# Patient Record
Sex: Female | Born: 1940 | Race: Black or African American | Hispanic: No | Marital: Married | Smoking: Former smoker
Health system: Southern US, Community
[De-identification: ages and names within clinical notes are randomized; demographics above are authoritative.]

## PROBLEM LIST (undated history)

## (undated) DIAGNOSIS — I10 Essential (primary) hypertension: Secondary | ICD-10-CM

## (undated) DIAGNOSIS — I701 Atherosclerosis of renal artery: Secondary | ICD-10-CM

## (undated) DIAGNOSIS — I2510 Atherosclerotic heart disease of native coronary artery without angina pectoris: Secondary | ICD-10-CM

## (undated) DIAGNOSIS — N189 Chronic kidney disease, unspecified: Secondary | ICD-10-CM

## (undated) DIAGNOSIS — Z8489 Family history of other specified conditions: Secondary | ICD-10-CM

## (undated) DIAGNOSIS — Z959 Presence of cardiac and vascular implant and graft, unspecified: Secondary | ICD-10-CM

## (undated) DIAGNOSIS — E785 Hyperlipidemia, unspecified: Secondary | ICD-10-CM

## (undated) DIAGNOSIS — I739 Peripheral vascular disease, unspecified: Secondary | ICD-10-CM

## (undated) HISTORY — DX: Chronic kidney disease, unspecified: N18.9

## (undated) HISTORY — DX: Peripheral vascular disease, unspecified: I73.9

## (undated) HISTORY — DX: Atherosclerotic heart disease of native coronary artery without angina pectoris: I25.10

## (undated) HISTORY — DX: Atherosclerosis of renal artery: I70.1

## (undated) HISTORY — DX: Essential (primary) hypertension: I10

## (undated) HISTORY — DX: Hyperlipidemia, unspecified: E78.5

---

## 1983-01-07 HISTORY — PX: GALLBLADDER SURGERY: SHX652

## 1999-06-24 ENCOUNTER — Encounter: Admission: RE | Admit: 1999-06-24 | Discharge: 1999-09-22 | Payer: Self-pay | Admitting: Emergency Medicine

## 1999-07-05 ENCOUNTER — Inpatient Hospital Stay (HOSPITAL_COMMUNITY): Admission: EM | Admit: 1999-07-05 | Discharge: 1999-07-10 | Payer: Self-pay | Admitting: Emergency Medicine

## 1999-07-05 ENCOUNTER — Encounter: Payer: Self-pay | Admitting: Emergency Medicine

## 1999-07-05 ENCOUNTER — Encounter (INDEPENDENT_AMBULATORY_CARE_PROVIDER_SITE_OTHER): Payer: Self-pay | Admitting: *Deleted

## 1999-07-06 HISTORY — PX: CORONARY ANGIOPLASTY WITH STENT PLACEMENT: SHX49

## 1999-07-07 ENCOUNTER — Encounter: Payer: Self-pay | Admitting: Emergency Medicine

## 1999-08-20 ENCOUNTER — Encounter: Admission: RE | Admit: 1999-08-20 | Discharge: 1999-08-20 | Payer: Self-pay | Admitting: Emergency Medicine

## 1999-08-20 ENCOUNTER — Encounter: Payer: Self-pay | Admitting: Emergency Medicine

## 2003-01-04 ENCOUNTER — Encounter: Admission: RE | Admit: 2003-01-04 | Discharge: 2003-01-04 | Payer: Self-pay | Admitting: Emergency Medicine

## 2003-01-12 ENCOUNTER — Encounter: Admission: RE | Admit: 2003-01-12 | Discharge: 2003-04-12 | Payer: Self-pay | Admitting: Emergency Medicine

## 2005-08-21 ENCOUNTER — Encounter: Admission: RE | Admit: 2005-08-21 | Discharge: 2005-08-21 | Payer: Self-pay | Admitting: Emergency Medicine

## 2008-10-24 ENCOUNTER — Encounter: Admission: RE | Admit: 2008-10-24 | Discharge: 2008-10-24 | Payer: Self-pay | Admitting: Emergency Medicine

## 2009-10-25 ENCOUNTER — Encounter: Admission: RE | Admit: 2009-10-25 | Discharge: 2009-10-25 | Payer: Self-pay | Admitting: Internal Medicine

## 2010-04-02 ENCOUNTER — Inpatient Hospital Stay (HOSPITAL_COMMUNITY)
Admission: RE | Admit: 2010-04-02 | Discharge: 2010-04-03 | DRG: 700 | Disposition: A | Discharge status: Home / Self Care | Payer: Medicare Other | Source: Ambulatory Visit | Attending: Cardiovascular Disease | Admitting: Cardiovascular Disease

## 2010-04-02 DIAGNOSIS — Z7982 Long term (current) use of aspirin: Secondary | ICD-10-CM

## 2010-04-02 DIAGNOSIS — I129 Hypertensive chronic kidney disease with stage 1 through stage 4 chronic kidney disease, or unspecified chronic kidney disease: Secondary | ICD-10-CM | POA: Diagnosis present

## 2010-04-02 DIAGNOSIS — Z794 Long term (current) use of insulin: Secondary | ICD-10-CM

## 2010-04-02 DIAGNOSIS — Z79899 Other long term (current) drug therapy: Secondary | ICD-10-CM

## 2010-04-02 DIAGNOSIS — N189 Chronic kidney disease, unspecified: Secondary | ICD-10-CM | POA: Diagnosis present

## 2010-04-02 DIAGNOSIS — I701 Atherosclerosis of renal artery: Principal | ICD-10-CM | POA: Diagnosis present

## 2010-04-02 DIAGNOSIS — I2510 Atherosclerotic heart disease of native coronary artery without angina pectoris: Secondary | ICD-10-CM | POA: Diagnosis present

## 2010-04-02 DIAGNOSIS — G20 Parkinson's disease: Secondary | ICD-10-CM | POA: Diagnosis present

## 2010-04-02 DIAGNOSIS — G20A1 Parkinson's disease without dyskinesia, without mention of fluctuations: Secondary | ICD-10-CM | POA: Diagnosis present

## 2010-04-02 LAB — GLUCOSE, CAPILLARY
Glucose-Capillary: 149 mg/dL — ABNORMAL HIGH (ref 70–99)
Glucose-Capillary: 135 mg/dL — ABNORMAL HIGH (ref 70–99)
Glucose-Capillary: 179 mg/dL — ABNORMAL HIGH (ref 70–99)

## 2010-04-03 LAB — BASIC METABOLIC PANEL
BUN: 54 mg/dL — ABNORMAL HIGH (ref 6–23)
Calcium: 8.2 mg/dL — ABNORMAL LOW (ref 8.4–10.5)
Creatinine, Ser: 3.13 mg/dL — ABNORMAL HIGH (ref 0.4–1.2)
GFR calc non Af Amer: 15 mL/min — ABNORMAL LOW (ref >60)
Glucose, Bld: 210 mg/dL — ABNORMAL HIGH (ref 70–99)

## 2010-04-03 LAB — CBC
HCT: 32.4 % — ABNORMAL LOW (ref 36.0–46.0)
MCH: 26.1 pg (ref 26.0–34.0)
MCHC: 32.1 g/dL (ref 30.0–36.0)
MCV: 81.4 fL (ref 78.0–100.0)
Platelets: 176 10*3/uL (ref 150–400)
RDW: 15 % (ref 11.5–15.5)

## 2010-04-03 LAB — GLUCOSE, CAPILLARY: Glucose-Capillary: 162 mg/dL — ABNORMAL HIGH (ref 70–99)

## 2010-04-03 NOTE — Procedures (Signed)
  NAMEALINAH, Sierra Riley                ACCOUNT NO.:  [id number]  MEDICAL RECORD NO.:  [id number]           PATIENT TYPE:  O  LOCATION:  6529                         FACILITY:  [facility]  PHYSICIAN:  Nanetta Batty, M.D.   DATE OF BIRTH:  1940/04/20  DATE OF PROCEDURE: DATE OF DISCHARGE:                   PERIPHERAL VASCULAR INVASIVE PROCEDURE   Abdominal aortogram with CO2.  Ms. Bocanegra is a 70 year old thin-appearing African American female, history of CAD, chronic renal insufficiency with a creatinines in the 2 range and hypertension.  She has been refractory to multiple medications.  Renal Dopplers showed moderate right renal artery stenosis and this has been documented on cath in the past as well.  The patient presents now for a CO2 angiography to rule out significant stenosis as a cause of hypertension.  PROCEDURE DESCRIPTION:  The patient was brought to the Second Floor [facility] in the postabsorptive state.  She is premedicated with p.o. Valium.  Her right groin was prepped and shaved in the usual sterile fashion. Xylocaine 1% was used for local anesthesia. A 6-French sheath was inserted into the right femoral artery using standard Seldinger technique.  A 5-French tennis racket catheter was used for midstream abdominal aortography using CO2.  Retrograde aortic pressures were monitored during the case.  ANGIOGRAPHIC RESULTS: 1. Right renal artery had 60% proximal stenosis. 2. Left renal artery was normal.  IMPRESSION:  Ms. Kniskern has moderate right renal artery stenosis, probably not significant enough to be causing renal insufficiency or renal vessel hypertension. Description of this has not changed compared to prior angiogram or cath. She will be treated for essential hypertension medically.  The patient's blood pressure was 240/90.  She will be treated with IV hydralazine for sheath removal, will be kept overnight to observe her groin and will be  discharged home with follow up with Dr. Alanda Amass. She left the lab in stable condition.     Nanetta Batty, M.D.     Cordelia Pen  D:  04/02/2010  T:  04/03/2010  Job:  [id number]  cc:   Second [facility] Richard A. Alanda Amass, M.D. Cecille Aver, M.D. Gaspar Garbe, M.D.  Electronically Signed by Nanetta Batty M.D. on 04/03/2010 05:10:45 PM

## 2010-04-16 NOTE — Discharge Summary (Signed)
[name], [name]                ACCOUNT NO.:  [id number]  MEDICAL RECORD NO.:  [id number]           PATIENT TYPE:  O  LOCATION:  6529                         FACILITY:  [facility]  PHYSICIAN:  Nanetta Batty, M.D.   DATE OF BIRTH:  24-Feb-1940  DATE OF ADMISSION:  04/02/2010 DATE OF DISCHARGE:  04/03/2010                              DISCHARGE SUMMARY   DISCHARGE DIAGNOSES: 1. Chronic renal failure with history by abdominal angiogram of 70%     right renal artery stenosis and discrepancy in outpatient followup     renal Dopplers with moderate disease of the right renal artery with     right renal artery renal velocity of 224/46, renal aortic ratio of     2.54.  There was concern to chronic renal failure that the renal     artery stenosis was increasing this problem.  CO2 and angiography     was planned. 2. 60% proximal right artery stenosis by CT angiogram.  Normal left     renal artery, 3. Coronary artery disease. 4. Severe systemic hypertension, refractory on multiple medications. 5. Parkinson disease.  DISCHARGED CONDITION:  Stable.  DISCHARGE MEDICATIONS:  See medication reconciliation sheet.  DISCHARGE INSTRUCTIONS: 1. Increase activity slowly.  May shower.  No lifting for 1 week.  No     driving for 2 days.  Low-sodium heart-healthy diabetic diet.  We     stopped the Vytorin due to possible interactions, now on Zetia and     Crestor. 2. Wash cath site with soap and water.  Call if any bleeding,     swelling, or drainage. 3. Follow up with Dr. Alanda Amass.  The office will call with date and     time.  HOSPITAL COURSE:  Sierra Riley was admitted electively for PV angio renal arteries with CO2 to evaluate her renal arteries as there was discrepancy between her last angiogram and renal Dopplers, and there was concern to her chronic renal failure if renal artery stenosis was causing issues.  She underwent CO2 angiogram without complications and was found to have 60% proximal  right renal artery stenosis.  PHYSICAL EXAMINATION:  VITAL SIGNS:  The next morning, her blood pressure was 168/64, pulse 67, respirations 15, temperature 98.5, oxygen saturation on room air 97%. HEART:  Regular rate and rhythm. EXTREMITIES:  Right groin was stable.  Sodium 139, potassium 4.4, BUN 54, creatinine 3.13, glucose 210. Hemoglobin 10.4, hematocrit 32.4, platelets 176, and WBC 6.3.  The patient was stable and ambulated without complications and discharged home.  She will follow up as an outpatient with Dr. Alanda Amass and her regular doctors at regular points.  Please note per Dr. Hazle Coca note, blood pressure during the procedure was 240/90.  He kept her overnight just to observe the groin to ensure no hematomas, which did not occur.  She was stable and discharged the next morning.     Darcella Gasman. Annie Paras, N.P.   ______________________________ Nanetta Batty, M.D.    LRI/MEDQ  D:  04/05/2010  T:  04/06/2010  Job:  [id number]  cc:   Gerlene Burdock A. Alanda Amass, M.D. Cecille Aver, M.D.  Gaspar Garbe, M.D.  Electronically Signed by Nada Boozer N.P. on 04/08/2010 11:00:52 AM Electronically Signed by Nanetta Batty M.D. on 04/16/2010 10:45:01 AM

## 2010-05-24 NOTE — Op Note (Signed)
[place]. [facility]  Patient:    Sierra Riley, Sierra Riley                         MRN: [id number] Adm. Date:  [id number] Attending:  Roque Lias CC:         Norton Blizzard, M.D. x 2             Reuben Likes, M.D.                           Operative Report  PREOPERATIVE DIAGNOSIS:  Mediastinal adenopathy.  POSTOPERATIVE DIAGNOSIS:  Mediastinal adenopathy.  OPERATION PERFORMED:  Fiberoptic bronchoscopy, mediastinoscopy.  SURGEON:  D. Karle Plumber, M.D.  ANESTHESIA:  General.  PROCEDURE:  After percutaneous insertion of all monitoring lines, the patient underwent general anesthesia.  Fiberoptic bronchoscopy was passed through the endotracheal tube.  The right upper lobe, right middle lobe, right lower lobe orifices were normal.  The carina was in the midline.  The left upper lobe and left lower lobe orifices were normal.  No endobronchial lesions were seen. Washings and cultures were taken with bronchial washings.  The fiberoptic bronchoscope was removed and the anterior neck was prepped and draped in usual sterile manner.  A transverse incision was made above the sternal notch and carried down with electrocautery to the subcutaneous tissue. The strap muscles were split.  The pretracheal fascia was entered.  The ______ was carried out and then the mediastinoscope was inserted and a biopsies of 4R and 2R nodes were done.  Strap muscles were closed with 2-0 Vicryl and subcutaneous tissue with 3-0 Vicryl and subcuticular with 3-0 Vicryl.  Steri-Strips were applied. The patient tolerated the procedure well. DD:  07/08/99 TD:  07/08/99 Job: [id number]

## 2010-05-24 NOTE — H&P (Signed)
[place]. [facility]  Patient:    Sierra Riley, Sierra Riley                         MRN: [id number] Adm. Date:  [id number] Attending:  Roque Lias                         History and Physical  CHIEF COMPLAINT:  "Chest pain."  HISTORY OF PRESENT ILLNESS:  The patient is a 70 year old female with diabetes and hypertension who has had a one-week history of recurring episodes of substernal chest pain with radiation to the neck, face, and right arm. The episodes last from one to four hours at a time and are described as "grabbing" and of 7-8/10 in intensity. They are associated with nausea, vomiting, sweats, dyspnea, and palpitations. The pains are nonexertional and tend to occur after meals. The patient has no known cardiac history.  PAST SURGICAL HISTORY:  The patient had a cholecystectomy in 1985.  OTHER HOSPITALIZATIONS:  None.  OTHER ILLNESSES:  The patient has been treated for hypertension since 1997, hyperlipidemia since 1997, and was just diagnosed with type 2 diabetes on June 18, 1999.  CURRENT MEDICATIONS: 1. HCTZ 25 mg once a day. 2. Verapamil SR 120 once a day. 3. Glucophage 500 mg b.i.d.  ALLERGIES:  No known drug allergies.  FAMILY HISTORY:  The patients father died of a heart attack. Her mother has high blood pressure and had a CVA. She had three brothers, two of whom have high blood pressure, and four sisters, one of whom has high blood pressure. She has four children all in good health.  SOCIAL HISTORY:  The patient works as a Public librarian. She does not use alcohol. She smokes a pack of cigarettes per day. She is married.  REVIEW OF SYSTEMS:  She denies any other systemic skin, eye, ENT, respiratory, cardiovascular, GI, GU, musculoskeletal, or neurological complaints.  PHYSICAL EXAMINATION:  VITAL SIGNS:  Blood pressure 150/60, pulse 58 and regular, respirations 20, temperature 98.5.  GENERAL:  The patient appears alert and  in no distress.  SKIN:  Warm and dry. No rash.  HEENT:  Eyes:  Pupils are equal, round, and reactive to light. Full EOMs. Has bilateral cataracts. Fundi benign. Sclerae nonicteric. ENT:  TMs normal. No intraoral lesions. Pharynx clear. Mucous membranes moist.  NECK:  Supple. No adenopathy, JVD, or bruits. Thyroid normal.  LUNGS:  Clear to auscultation and percussion.  HEART:  Regular rhythm. No murmur, clicks, rubs, or gallops.  CHEST:  No chest wall tenderness.  ABDOMEN:  Soft and nontender without organomegaly or mass. Bowel sounds normally active. No pulsatile midline abdominal mass or bruit.  EXTREMITIES:  No peripheral edema. Pedal pulses are full. Foot exam was normal.  NEUROLOGICAL:  Alert and oriented x 3. Speech was clear and appropriate. No focal muscular weakness or tremor. DTRs 2+ and symmetrical. Babinskis downgoing. Cranial nerves intact.  LABORATORY DATA:  An EKG showed sinus bradycardia but otherwise was normal.  Chest x-ray showed a lobulated contour to both hila.  A chest CT showed hilar and mediastinal adenopathy.  IMPRESSION: 1. Chest pain, rule out angina or possibly due to lung cancer. 2. Hilar adenopathy, possibly due to lung cancer or sarcoidosis. 3. Type 2 diabetes. 4. Hypertension. 5. Hyperlipidemia. 6. History of nicotine addiction.  PLAN: 1. Admit to telemetry. 2. Serial CPK, MBs, and EKGs. 3. Cardiology consult. 4. CVTS consult. DD:  07/05/99 TD:  07/05/99 Job: [id number]

## 2010-05-24 NOTE — Op Note (Signed)
[place]. [facility]  Patient:    KAILLY, RICHOUX                         MRN: [id number] Proc. Date: 07/08/99 Adm. Date:  [id number] Attending:  Roque Lias CC:         Norton Blizzard, M.D. (2 copies)             Reuben Likes, M.D.                           Operative Report  PREOPERATIVE DIAGNOSIS:  Mediastinal adenopathy.  POSTOPERATIVE DIAGNOSIS:  Mediastinal adenopathy.  OPERATION:  Thyroid bronchoscopy and mediastinoscopy.  SURGEON:  D. Karle Plumber, M.D.  ANESTHESIA:  General.  DESCRIPTION OF OPERATION:  After percutaneous insertion of all monitoring lines, the patient underwent general anesthesia.  The anterior thryroid bronchoscope was passed through the endotracheal tube.  The right upper lobe, right middle lobe and right lower lobe orifices were normal.  The carina was in the midline.  The left upper lobe and left lower lobe orifices were normal. No intrabronchial lesions were seen.  Washings and cultures were taken with bronchial washings.  The fiberoptic bronchoscope was removed.  The anterior neck was prepped and draped in the usual sterile manner.  A transverse incision were made above the sternal notch and carried down to the subcutaneous tissue.  The strap muscles were split and the pretracheal fascia was entered.  Digital exploration was carried out and then the mediastinoscope was inserted and biopsy of 4R and 2R nodes were done.  The strap muscles were closed with 2-0 Vicryl, subcutaneous tissue with 3-0 Vicryl, and subcuticular with 3-0 Vicryl.  Steri-Strips were applied.  The patient tolerated the procedure well. DD:  07/08/99 TD:  07/08/99 Job: [id number]

## 2010-05-24 NOTE — Op Note (Signed)
[place]. [facility]  Patient:    Sierra Riley, Sierra Riley                         MRN: [id number] Proc. Date: 07/05/99 Adm. Date:  [id number] Attending:  Roque Lias CC:         Reuben Likes, M.D.             D. Karle Plumber, M.D.             Cardiac Catheterization Lab                           Operative Report  PROCEDURE:  Retrograde central aortic catheterization, selective coronary angiography pre and post intracoronary nitroglycerin administration, left ventricular angiography RAO and LAO projection, subselective left internal mammary artery-right internal mammary artery, abdominal aortic angiogram hand injection mid stream PA projection, Plavix p.o., weight-adjusted heparin, Aggrastat bolus plus infusion, percutaneous transluminal coronary angioplasty and stent using double-wire technique, mid circumflex-PABG, percutaneous transluminal coronary angioplasty and stent mid left anterior descending artery, percutaneous transluminal coronary angioplasty proximal nondominant right coronary artery high grade stenosis.  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  She has a history of elevated cholesterol, probably recent diagnosis; mild hypertension.  She has a one-week history of recurrent episodic chest pain.  Approximately seven to eight days prior to admission, she had prolonged chest pain that was substernal and lasted through the night. Several days later, she had a recurrent episode.  Two nights ago, she had another recurrent episode that lasted most of the night and this morning, she has recurrent substernal pressure/pain radiating to her shoulders and upper left arm prompting her to come to emergency room, similar to her prior episodes.  It was relieved in the ER with analgesic and intravenous nitroglycerin  administration.  EKG showed nonspecific diffuse inferolateral ST changes.  The patient was given 5000 units of heparin, 150 of Plavix in the emergency room as ancillary therapy.  X-rays showed significant bilateral hilar adenopathy, right greater than left, and the patient has had a recent cough without hemoptysis.  There have been no constitutional symptoms of weight loss, fever, chills, anorexia, or pruritus. CT scan showed bilateral diffuse adenopathy.  She was seen in consultation by Dr. Dewayne Shorter who felt that this should be worked up and that she would require a mediastinoscopy.  Because of her history of ischemic chest pain, significant risk factors, and abnormal ST segment changes, it was felt best to proceed with angiography prior to this.  PROCEDURAL NOTE:  Catheterization was done through a 6-French Cordis sidearm sheath that was placed in the RFA by modified Seldinger technique with a #18 thin-walled needle after 5 mg of Valium p.o. premedication.  The patient received 2 mg of Versed and 2 mg of Nubain for sedation in the lab.  Hexabrix dye was used throughout the procedure.  Catheters were 6-French 4-cm taper, preformed Scimed coronary and pigtail catheters for coronary angiography, subselective LIMA-RIMA, LV angiogram in the RAO and LAO projection, 25 cc/14 cc per second, 20 cc/12 cc per second.  Abdominal angiogram was done in the mid stream PA projection above the level of the renal arteries at 25 cc/20 cc per second.  Because of right renal artery stenosis, right renal angiogram was done using the  right coronary catheter and hand injection.  This demonstrated 70% proximal segmental right renal artery stenosis.  The catheter was removed, and the sidearm sheath was flushed.  The patient was pain-free and stable at this time.  FINDINGS:  LV angiogram in the RAO and LAO projection showed hypo-akinesis of the basilar third of the inferior wall and posteroapical  hypo-akinesis.  Overall systolic function was well preserved at approximately 55% despite these wall motion abnormalities, and there was mild angiographic LVH.  There was no prolapse seen.  Fluoroscopy revealed +1 right coronary calcification.  There was no significant intracardiac or valvular calcification.  1. The main left coronary was normal. 2. The LAD had 75% concentric stenosis at the junction of the proximal mid    third after the large first diagonal branch.  The remainder of the LAD had    no significant stenosis, but there were minor irregularities compatible    with noncritical coronary atherosclerosis in the proximal third of the LAD    and the mid portion.  The vessel was large and coursed to the apex of the    heart with TIMI-3 flow.  The first diagonal was normal, arising from the    proximal third of the LAD and bifurcated. 3. The circumflex artery was a dominant vessel.  There was 40% narrowing after    two atrial branches and before the large first marginal branch which    bifurcated.  Between the first marginal, the PABG, and the large distal    circumflex, there was a 95% stenosis that was concentric and extended    beyond the bifurcation and involving the origin of the PABG branch and just    beyond this in the circumflex proper.  This was a near-bifurcation lesion.    There was TIMI-3 flow to the distal vessel. 4. The right coronary artery was a nondominant vessel.  It was predominantly    two large RV branches.  The largest branch bifurcated and had a 95%    stenosis in its ostial portion just after a smaller RV branch.  The smaller    RV branch had tandem 60% beadlike stenosis but good flow and no critical    stenosis.  In view of the patients history as outlined above, it was felt best to proceed with percutaneous intervention since she would need a workup.  We also wanted short-acting anticoagulants and antiplatelet therapy.  With her history of diabetes, it  was felt necessary to use 2b3a inhibitors in this setting as well.  The left coronary was intubated with a JL4 Scimed 6-French guiding catheter.  HTF wire was used to cross the circumflex stenosis which was free in the circumflex proper across the lesion.  The lesion was predilated with an ACS 2.5 x 15-mm CrossSail balloon at 5-49.  The PABG branch was intact after this. The mid circumflex was then stented with an AVE-Medtronic S7 3.0 x 15-mm stent at 10 atmospheres for 38 seconds.  There was excellent stent apposition and full expansion on scout injection.  The PABG branch, however, had a 95% pinch lesion at its ostium in an area where there was plaque.  For this reason, we left a wire across the stent and used the second HTF wire, crossed through the AVE stent into the PABG branch which supplied the PDA and PLA on this dominant circumflex vessel.  The side branch was dilated with a 2.5 x 9-mm Maverick balloon at 10 atmospheres for 44 seconds.  The  stent was widely expanded, and the ostial stenosis was reduced from 95% to less than 20% in the PABG branch with TIMI-3 flow.  We then approached the LAD.  In the proximal-mid LAD beyond the first diagonal branch was crossed with an HTF wire, and the lesion was primarily stented with a 3.0 x 12-mm NIR stent which was deployed at 13 atmospheres for 64 seconds. Final injections showed stenosis reduction of 75% to 0% with good TIMI-3 flow in the LAD.  There was some minor 30% narrowing beyond it and minimal systolic impression in the mid LAD but good flow and no dissection.  We then intubated the right coronary artery with a hockey-stick 6-French guiding catheter.  A Aneliz PT 0.014-inch Scimed wire was used to cross the stenosis into the large bifurcating RV branch.  The RV branch was then dilated across the other RV branch with a 2.5 x 9-mm Maverick at nine for 30, and 10 atmospheres for 44 seconds.  The balloon was pulled back, and  injections showed stenosis reduction of 95% to less than 20% residual.  There was some beading in this area but the result was nice and smooth and almost "stent-like".  There was some ectasia of this nondominant RCA proximal to the lesion and just beyond it.  Because of the good angiographic result, the size of the vessel and the fact that there was proximal distal ectasia before the lesion, we elected not to stent this lesion.  There was TIMI-3 flow, and the patient was stable.  CONCLUSION:  The patient has had successful multi-vessel, multi-lesion intervention for high grade three-vessel disease.  This was done in the setting of significant bilateral hilar adenopathy which is a new diagnosis and cough in a patient who is a smoker without any evidence of lung mass on CT.  RECOMMENDATIONS:  I would recommend vigorous continued medical therapy of hypertension, diabetes, hyperlipidemia, risk modification with discontinuation of smoking.  I believe the patients 2b3a inhibitor should well be out of the system in several days and that she would be a candidate for further workup with planned mediastinoscopy per Dr. Edwyna Shell.  CATHETERIZATION DIAGNOSES:  1. Arteriosclerotic heart disease, probable out-of-hospital small DMI seven     to nine days prior to admission.  2. Three-vessel coronary artery disease.  3. Successful culprit lesion percutaneous transluminal coronary angioplasty     and stent, double-wire technique, mid dominant circumflex stent and     percutaneous transluminal coronary angioplasty side branch large PABG.  4. Successful percutaneous transluminal coronary angioplasty and stent, high     grade proximal-mid left anterior descending artery stenosis.  5. Successful percutaneous transluminal coronary angioplasty, high grade     proximal nondominant right coronary artery stenosis.  6. Good systolic function, ejection fraction approximately 55% with inferior     and posteroapical  wall motion abnormalities.  7. Systemic hypertension, mild.  8. Hyperlipidemia.  9. Recent diagnosis of adult-onset diabetes mellitus. 10. Bilateral hilar adenopathy with cough, workup in progress. 11. Cigarette abuse.  DD:  07/05/99 TD:  07/06/99 Job: [id number]

## 2010-05-24 NOTE — Discharge Summary (Signed)
[place]  Patient:    Sierra Riley, Sierra Riley                         MRN: [id number] Adm. Date:  [id number] Disc. Date: [id number] Attending:  Roque Lias                           Discharge Summary  HISTORY OF PRESENT ILLNESS:  The patient is a 70 year old female with diabetes and hypertension, who has had a one-week history of recurring episodes of substernal chest pain with radiation to the neck, face, and right arm.  The episodes last from one to four hours at a time and are described as a "grabbing" pain with intensity of 7-8/10.  They are associated with nausea, vomiting, sweats, dyspnea, and palpitations.  The pains are non-exertional and tend to occur after meals.  The patient has no known cardiac history.  PHYSICAL EXAMINATION:  Blood pressure 150/60, pulse 58 and regular, respirations 20, temperature 98.5 degrees.  GENERAL APPEARANCE:  She appeared alert and in no distress.  SKIN:  Warm and dry.  HEENT:  PERRL.  Fundi benign.  Has bilateral cataracts.  ENT unremarkable.  NECK:  No adenopathy, JVD, or bruit.  Thyroid normal.  LUNGS:  Clear.  CARDIAC:  Rhythm regular without murmurs, rubs, or gallops.  CHEST:  No chest wall tenderness.  ABDOMEN:  Soft and nontender without organomegaly or mass.  Bowel sounds normally active.  No pulsatile midline abdominal mass or bruit.  EXTREMITIES:  No pedal edema.  The peripheral pulses were full.  The foot exam was negative.  NEUROLOGIC:  Exam within normal limits.  ADMITTING IMPRESSION: 1. Chest pain, rule out angina or possibly due to lung cancer. 2. Hilar adenopathy possibly due to lung cancer or sarcoidosis. 3. Recent onset of type 2 diabetes. 4. Hypertension. 5. Hyperlipidemia. 6. History of nicotine addiction.  LABORATORY DATA:  The EKG on admission showed normal sinus rhythm with occasional premature ectopic complexes, otherwise normal.  A follow-up tracing the next day  revealed inverted T wave in lead 3, but otherwise appeared to be normal.  A CT of the lower extremities revealed no evidence of DVT and uterine fibroids.  A chest x-ray revealed a lobulated contour to both hila.  A CT of the chest revealed marked hilar and mediastinal adenopathy extending to the level of the upper abdomen.  No definite lung mass was noted.  The chest x-ray revealed mild bilateral bibasilar atelectasis on July 07, 1999.  The hemoglobin was 14.2 and white count 6300.  CMET revealed a sodium of 137, potassium 3.3, glucose 164, BUN 16, and creatinine 0.7.  The angiotensin converting enzyme was 22.  The CPK on admission was normal with negative MBs. Troponins were within normal limits.  However, on July 06, 1999, her total CPK was 84 and MB was 6.5, which is positive.  A lipid panel revealed a cholesterol of 165, triglycerides 92, HDL 34, and LDL 113.  The urinalysis revealed positive nitrites, moderate leukocyte esterase, 6-10 wbcs, and many bacteria.  Blood cultures were negative.  Bronchial washings were negative. Lymph nodes were negative.  HOSPITAL COURSE:  The patient was hospitalized on the 3300 floor.  She was put at bed rest with bathroom privileges.  Vital signs were obtained every two hours for 12 hours and then every four hours.  She was given morphine for pain, Phenergan for nausea  and vomiting, Prevacid 30 mg b.i.d., HCTZ 25 mg once a day, and glyburide 5 mg per day.  She was seen in consultation by Gerlene Burdock A. Alanda Amass, M.D.  On the day of admission, she was taken to the catheterization laboratory and underwent heart catheterization, which did show three-vessel disease.  She was angioplastied and then stented.  She tolerated this procedure well.  By the following day, she had no chest pain since the angioplasty.  Her vital signs were stable.  She was transferred from there to the heart floor and was seen by CVTS for possible biopsy.  She was continued on Plavix  and aspirin.  She was given Aggrastat post angioplasty.  She did develop a temperature of 100.9 degrees on July 07, 1999, and was given IV Levaquin.  Her temperature came down and remained stable throughout the remainder of her hospital stay.  On July 08, 1999, she underwent mediastinoscopy.  This showed a granulomatous lymphadenitis consistent with sarcoidosis.  The patient tolerated the procedure well.  She had no further chest discomfort.  As of July 10, 1999, she had slight chest soreness around the incision, but no other chest pain and no cough or dyspnea.  She was afebrile.  Her blood pressure was 125/75.  Her lungs showed some rales at the bases.  The cardiac rhythm was regular with no murmur.  The abdomen was negative.  It was felt she could be discharged on that date to be followed up as an outpatient.  FINAL DIAGNOSES: 1. Ischemic heart disease, status post angioplasty. 2. Sarcoidosis. 3. Hypertension. 4. Diabetes. 5. Hyperlipidemia. 6. Nicotine addiction.  DISCHARGE MEDICATIONS: 1. Toprol XL 25 mg once a day. 2. Plavix 75 mg once a day for four weeks and then stop. 3. Altace 2.5 mg once a day. 4. Lipitor 10 mg once a day. 5. Nitroglycerin 1/150 sublingual as needed. 6. Enteric-coated aspirin 325 mg once a day. 7. Wellbutrin SR 150 mg b.i.d. eight hours apart to facilitate smoking    cessation. 8. Glyburide 5 mg once a day.  ACTIVITY:  No restriction.  DIET:  Low-fat diabetic diet.  SPECIAL INSTRUCTIONS:  Should discontinue smoking.  FOLLOW-UP:  See me in a week.  Follow up with Richard A. Alanda Amass, M.D., as well. DD:  07/27/99 TD:  07/30/99 Job: [id number]

## 2010-06-13 ENCOUNTER — Encounter: Payer: Medicare Other | Admitting: Vascular Surgery

## 2010-07-08 ENCOUNTER — Encounter: Payer: Self-pay | Admitting: Vascular Surgery

## 2010-07-18 ENCOUNTER — Encounter (INDEPENDENT_AMBULATORY_CARE_PROVIDER_SITE_OTHER): Payer: Medicare Other

## 2010-07-18 ENCOUNTER — Ambulatory Visit (INDEPENDENT_AMBULATORY_CARE_PROVIDER_SITE_OTHER): Payer: Medicare Other | Admitting: Vascular Surgery

## 2010-07-18 ENCOUNTER — Encounter: Payer: Self-pay | Admitting: Vascular Surgery

## 2010-07-18 VITALS — BP 145/71 | HR 54 | Resp 16

## 2010-07-18 DIAGNOSIS — N186 End stage renal disease: Secondary | ICD-10-CM

## 2010-07-18 DIAGNOSIS — N184 Chronic kidney disease, stage 4 (severe): Secondary | ICD-10-CM

## 2010-07-18 DIAGNOSIS — Z01810 Encounter for preprocedural cardiovascular examination: Secondary | ICD-10-CM

## 2010-07-18 NOTE — Progress Notes (Signed)
Patient is a 70 year old female referred for evaluation for placement of long-term hemodialysis access. Renal failure thought to be primarily secondary to hypertension. She is right-handed. She has had no prior access procedures.  Other chronic medical problems include diabetes hyperlipidemia, coronary artery disease, Parkinson's disease, peripheral arterial disease these problems are currently stable and followed by her primary care doctor.  Social history: Married, 4 children, former smoker quit in 2001, does not consume alcohol regularly  Family history mother hypertension and kidney disease, father vascular disease  Review of systems:  Gen.: Loss of appetite  Vascular: Pain in the legs when walking [place]  Cardiac negative  GI constipation  Neurologic negative  Ulnar A. negative  Hematologic negative  Urinary some frequency  EENT decreased eyesight  Musculoskeletal multiple joint pain.  Psychiatric negative  Skin negative  Physical exam  HEENT negative Chest clear to auscultation  Cardiac regular rate and rhythm without murmur  Abdomen nontender soft no mass  Neurologic upper extremity and lower extremity motor strength are 5 over 5  Skin no open ulcers or rashes  Vascular: Vaguely palpable radial pulse 2+ brachial pulse bilaterally  She had a vein mapping ultrasound today which I reviewed and interpreted shows the cephalic vein in the left upper arm would be reasonable for placement of a fistula. Diameter was greater than 26 mm. The right basilic vein was greater than 3 mm. The left basilic vein was marginal.  Assessment: Patient needs long-term hemodialysis access. Patient was educated today on the difference between the fistula and a graft as well as the advantages and disadvantages. I believe the best option for replacement of a left brachiocephalic AV fistula. She is not a candidate for radial procedure due to diminished pulse.  Plan: the patient wishes to  discuss the option of peritoneal dialysis versus hemodialysis further with her nephrologist she will call her she wishes to schedule the procedure in the near future.

## 2010-07-18 NOTE — Progress Notes (Signed)
EVAL FOR RENAL ACCESS

## 2010-07-30 NOTE — Procedures (Unsigned)
CEPHALIC VEIN MAPPING  INDICATION:  Preoperative vein mapping of the upper extremities.  HISTORY: Chronic kidney disease stage 4.  EXAM: The right cephalic vein is compressible from the antecubital fossa to forearm level with diameter measurements ranging from 0.21 to 0.26 cm. The brachium level right cephalic vein was not adequately visualized.  The right basilic vein is compressible with diameter measurements ranging from 0.24 to 0.38 cm.  The left cephalic vein is compressible with diameter measurements ranging from 0.24 to 0.30 cm.  The left basilic vein is compressible with diameter measurements ranging from 0.21 to 0.45 cm.  See attached worksheet for all measurements.  IMPRESSION:  Patent bilateral cephalic and basilic veins with diameter measurements as described above.  ___________________________________________ Janetta Hora. Fields, MD  CH/MEDQ  D:  07/19/2010  T:  07/19/2010  Job:  [id number]

## 2010-08-26 ENCOUNTER — Encounter (HOSPITAL_COMMUNITY)
Admission: RE | Admit: 2010-08-26 | Discharge: 2010-08-26 | Disposition: A | Discharge status: Home / Self Care | Payer: Medicare Other | Source: Ambulatory Visit | Attending: Vascular Surgery | Admitting: Vascular Surgery

## 2010-08-26 ENCOUNTER — Other Ambulatory Visit: Payer: Self-pay | Admitting: Vascular Surgery

## 2010-08-26 DIAGNOSIS — N186 End stage renal disease: Secondary | ICD-10-CM

## 2010-08-26 LAB — BASIC METABOLIC PANEL
Calcium: 9.9 mg/dL (ref 8.4–10.5)
GFR calc Af Amer: 15 mL/min — ABNORMAL LOW (ref >60)
GFR calc non Af Amer: 12 mL/min — ABNORMAL LOW (ref >60)
Glucose, Bld: 76 mg/dL (ref 70–99)
Sodium: 145 mEq/L (ref 135–145)

## 2010-08-26 LAB — CBC
Hemoglobin: 12.4 g/dL (ref 12.0–15.0)
MCH: 27 pg (ref 26.0–34.0)
MCHC: 32.4 g/dL (ref 30.0–36.0)
Platelets: 230 10*3/uL (ref 150–400)
RDW: 14.8 % (ref 11.5–15.5)

## 2010-08-26 LAB — SURGICAL PCR SCREEN: Staphylococcus aureus: POSITIVE — AB

## 2010-08-27 ENCOUNTER — Ambulatory Visit (HOSPITAL_COMMUNITY)
Admission: RE | Admit: 2010-08-27 | Discharge: 2010-08-27 | Disposition: A | Discharge status: Home / Self Care | Payer: Medicare Other | Source: Ambulatory Visit | Attending: Vascular Surgery | Admitting: Vascular Surgery

## 2010-08-27 ENCOUNTER — Ambulatory Visit (HOSPITAL_COMMUNITY): Payer: Medicare Other

## 2010-08-27 DIAGNOSIS — Z79899 Other long term (current) drug therapy: Secondary | ICD-10-CM | POA: Insufficient documentation

## 2010-08-27 DIAGNOSIS — N186 End stage renal disease: Secondary | ICD-10-CM | POA: Insufficient documentation

## 2010-08-27 DIAGNOSIS — E119 Type 2 diabetes mellitus without complications: Secondary | ICD-10-CM | POA: Insufficient documentation

## 2010-08-27 DIAGNOSIS — I120 Hypertensive chronic kidney disease with stage 5 chronic kidney disease or end stage renal disease: Secondary | ICD-10-CM

## 2010-08-27 DIAGNOSIS — Z01818 Encounter for other preprocedural examination: Secondary | ICD-10-CM | POA: Insufficient documentation

## 2010-08-27 DIAGNOSIS — D869 Sarcoidosis, unspecified: Secondary | ICD-10-CM | POA: Insufficient documentation

## 2010-08-27 DIAGNOSIS — Z01812 Encounter for preprocedural laboratory examination: Secondary | ICD-10-CM | POA: Insufficient documentation

## 2010-08-27 HISTORY — PX: AV FISTULA PLACEMENT: SHX1204

## 2010-08-27 LAB — GLUCOSE, CAPILLARY: Glucose-Capillary: 131 mg/dL — ABNORMAL HIGH (ref 70–99)

## 2010-08-27 LAB — POCT I-STAT 4, (NA,K, GLUC, HGB,HCT)
Hemoglobin: 11.9 g/dL — ABNORMAL LOW (ref 12.0–15.0)
Potassium: 5.5 mEq/L — ABNORMAL HIGH (ref 3.5–5.1)

## 2010-08-29 ENCOUNTER — Encounter: Payer: Self-pay | Admitting: Vascular Surgery

## 2010-09-05 NOTE — Op Note (Signed)
  NAMELILIANI, BOBO                ACCOUNT NO.:  [id number]  MEDICAL RECORD NO.:  [id number]  LOCATION:  [facility]                         FACILITY:  [facility]  PHYSICIAN:  Janetta Hora. Austyn Perriello, MD  DATE OF BIRTH:  [birth year]  DATE OF PROCEDURE:  08/27/2010 DATE OF DISCHARGE:                              OPERATIVE REPORT   PROCEDURE:  Left brachiocephalic arteriovenous fistula.  PREOPERATIVE DIAGNOSIS:  End-stage renal disease.  POSTOPERATIVE DIAGNOSIS:  End-stage renal disease.  ANESTHESIA:  General.  ASSISTANT:  Sara Chu, RNFA  OPERATIVE FINDINGS:  A 2.5 mm left cephalic vein.  OPERATIVE DETAILS:  After obtaining informed consent, the patient was taken to operating room.  The patient was placed in supine position on the operating room table.  After induction of general anesthesia and placement of laryngeal mask, the patient's entire left upper extremity was prepped and draped in usual sterile fashion.  Next, a transverseincision was made near the antecubital crease in the left arm.  Incision was carried down through subcutaneous tissues down level of the left cephalic vein.  Cephalic vein was dissected free circumferentially.  It was approximately 2 mm in diameter but overall good quality.  Small side branches ligated, divided between silk ties or clips.  Brachial artery was then dissected free in the medial portion incision.  The brachial artery proximal 2-2.5 mm in diameter.  This was dissected free circumferentially and small side branches ligated divided between silk ties.  The patient was given 5000 units of intravenous heparin.  After 2 minutes circulation time, the distal cephalic vein was ligated with 2-0 silk tie.  The vein transected and swung over the level of the artery. Vein was gently distended with heparinized saline and marked for orientation.  Vessel loops were used to control the artery proximally and distally.  A longitudinal opening was made in the left  brachial artery.  The vein was sewn end of vein to side of artery using a running 7-0 Prolene suture.  Just prior to completion anastomosis, back bled thoroughly flushed.  Anastomosis was secured, clamps released as palpable thrill in the fistula immediately.  Hemostasis was obtained. Subcutaneous tissues reapproximated with running 3-0 Vicryl suture. Skin was closed with 4-0 Vicryl subcuticular stitch.  Dermabond was applied in the incision.  1% lidocaine local anesthesia was infiltrated around the incision itself.  The patient had audible radial Doppler signal at the end of the case.  Instruments, sponge, and needle counts correct at the endo of the case.     Janetta Hora. Ashleyann Shoun, MD     CEF/MEDQ  D:  08/27/2010  T:  08/27/2010  Job:  [id number]  Electronically Signed by Fabienne Bruns MD on 09/05/2010 06:31:26 PM

## 2010-10-07 ENCOUNTER — Encounter: Payer: Self-pay | Admitting: Physician Assistant

## 2010-10-08 ENCOUNTER — Encounter: Payer: Self-pay | Admitting: Physician Assistant

## 2010-10-08 HISTORY — PX: NM MYOCAR PERF WALL MOTION: HXRAD629

## 2010-10-09 ENCOUNTER — Ambulatory Visit (INDEPENDENT_AMBULATORY_CARE_PROVIDER_SITE_OTHER): Payer: Medicare Other | Admitting: Physician Assistant

## 2010-10-09 VITALS — BP 166/71 | HR 71 | Resp 20 | Ht 63.0 in | Wt 151.7 lb

## 2010-10-09 DIAGNOSIS — N186 End stage renal disease: Secondary | ICD-10-CM

## 2010-10-09 NOTE — Progress Notes (Signed)
[facility] [place]  Postoperative Visit hemodialysis access   Date of Surgery:  08/27/10 Surgeon: Darrick Penna Nephrologist:  Kathrene Bongo  HPI: Sierra Riley is a 70 y.o. female who is 6 weeks S/P creation of left B-C AVF. The patient denies symptoms of numbness, tingling, weakness and denies pain in the operative limb. Patient is here for post -op evaluation to assess healing and maturation of left AVF .  Pt is not on hemodialysis yet.  Physical Examination  Filed Vitals:   10/09/10 1315  BP: 166/71  Pulse: 71  Resp: 20    WDWN female in NAD.  left upper extremity Incision is clean, dry, intact Radial 2+ Skin color is normal, no cyanosis, jaundice, pallor or bruising, normal   Hand grip is 5/5 and sensation in digits is intact; There is a good  thrill and good  bruit in the left B-C AVF. The fistula is easily palpable and of inadequate size.  Vein is of small caliber but is superficial up to prox brachium  Assessment/Plan Sierra Riley is a 70 y.o. year old female who presents s/p creation of left upper arm Hemodialysis access. Follow-up in 6-8 weeks to reassess maturation.  Clinic MD: Edilia Bo

## 2010-11-20 ENCOUNTER — Encounter: Payer: Self-pay | Admitting: Vascular Surgery

## 2010-11-21 ENCOUNTER — Ambulatory Visit (INDEPENDENT_AMBULATORY_CARE_PROVIDER_SITE_OTHER): Payer: Medicare Other | Admitting: Vascular Surgery

## 2010-11-21 ENCOUNTER — Other Ambulatory Visit (INDEPENDENT_AMBULATORY_CARE_PROVIDER_SITE_OTHER): Payer: Medicare Other | Admitting: *Deleted

## 2010-11-21 ENCOUNTER — Encounter: Payer: Self-pay | Admitting: Vascular Surgery

## 2010-11-21 VITALS — BP 185/76 | HR 67 | Resp 18 | Ht 61.0 in | Wt 153.0 lb

## 2010-11-21 DIAGNOSIS — N186 End stage renal disease: Secondary | ICD-10-CM

## 2010-11-21 NOTE — Progress Notes (Signed)
[facility] [place] HISTORY AND PHYSICAL    History of Present Illness:  Patient is a 70 y.o. year old female who presents for post-operative follow-up after placement of a left brachiocephalic AV fistula 08/27/10. She is currently not on hemodialysis. She has followup scheduled with her nephrologist in early December. She denies any symptoms of steal in the hand. She returns today to assess maturity of the fistula.   Physical Examination  Filed Vitals:   11/21/10 1255  BP: 185/76  Pulse: 67  Resp: 18    Body mass index is 28.91 kg/(m^2).  General:  Alert and oriented, no acute distress Neck: No bruit or JVD Skin: No rash Extremities:  Easily palpable thrill in the left upper arm AV fistula. The vein is palpable throughout most of its course. Neurologic: Upper and lower extremity motor 5/5 and symmetric  DATA: She had an ultrasound of her fistula today which showed the diameter is between 30 and 70 mm in diameter. The 30 mm portion is in the mid section. There was also one large side branch proximally.   ASSESSMENT: Maturing AV fistula left arm which is easily palpable. She is currently not on dialysis. She does have some evidence of a mid fistula stenosis. However I would delay doing a shuntogram at this point to give her fistula more time to develop unless she is immanently going to need dialysis at that point we could give her contrast to consider intervention if necessary. I will await her nephrologist review of her fistula at her next office visit it looks like the fistula is not going to be usable we will schedule her in the near future after that for fistulogram if the risk of contrast nephropathy is outweighed by imminent dialysis.   PLAN:   Fabienne Bruns, MD [facility] [place] Office: [phone] Pager: [phone]

## 2010-12-06 NOTE — Procedures (Unsigned)
VASCULAR LAB EXAM  INDICATION:  Complications involving left brachiocephalic fistula for dialysis access.  HISTORY: Diabetes: Cardiac: Hypertension:  EXAM: 1. Left brachiocephalic AV fistula is patent with an area of elevated     velocities in the mid upper arm. 2. See diagram for details.  IMPRESSION:  Patent left brachiocephalic fistula, as described above.  ___________________________________________ Janetta Hora. Fields, MD  LT/MEDQ  D:  11/22/2010  T:  11/22/2010  Job:  [id number]

## 2010-12-18 ENCOUNTER — Encounter: Payer: Self-pay | Admitting: Vascular Surgery

## 2010-12-19 ENCOUNTER — Encounter: Payer: Self-pay | Admitting: Vascular Surgery

## 2010-12-19 ENCOUNTER — Ambulatory Visit (INDEPENDENT_AMBULATORY_CARE_PROVIDER_SITE_OTHER): Payer: Medicare Other | Admitting: Vascular Surgery

## 2010-12-19 ENCOUNTER — Other Ambulatory Visit (INDEPENDENT_AMBULATORY_CARE_PROVIDER_SITE_OTHER): Payer: Medicare Other

## 2010-12-19 VITALS — BP 167/64 | HR 63 | Resp 14 | Ht 61.0 in | Wt 152.0 lb

## 2010-12-19 DIAGNOSIS — T82898A Other specified complication of vascular prosthetic devices, implants and grafts, initial encounter: Secondary | ICD-10-CM

## 2010-12-19 DIAGNOSIS — N186 End stage renal disease: Secondary | ICD-10-CM

## 2010-12-19 NOTE — Progress Notes (Signed)
History of Present Illness: Patient is a 70 y.o. year old female who presents for post-operative follow-up after placement of a left brachiocephalic AV fistula 08/27/10. She is currently not on hemodialysis. She returns today to assess maturity of the fistula. She denies any symptoms of steal. She currently is not exercising the fistula.  Review of systems: Skin denies any itching or uremic type symptoms, pulmonary no shortness of breath or suggestion of congestive failure, cardiac denies chest pain  Physical exam: Filed Vitals:   12/19/10 1046  BP: 167/64  Pulse: 63  Resp: 14  Height: 5\' 1"  (1.549 m)  Weight: 152 lb (68.947 kg)  SpO2: 98%   Left upper extremity: Easily palpable thrill and audible bruit left upper arm AV fistula left hand is well perfused and warm, incisions are all healed vein is palpable throughout the entire course of the upper arm  She had a duplex ultrasound today which I reviewed and interpreted. She did have some elevated velocities in the midportion of the fistula as well as the proximal aspect of the fistula. This could be suggestive of some mild narrowing. However on exam the fistula seems to be maturing nicely. The fistula diameter was essentially 6 mm throughout its course. The vein was 3 mm in diameter preoperatively.  Assessment: Maturing AV fistula left arm  Plan: It should be ready for cannulation at any point. If there is any problem with the flow after cannulation we'll consider doing a fistulogram. Patient will followup with me on as-needed basis otherwise.  Fabienne Bruns, MD [facility] [place] Office: [phone] Pager: [phone]

## 2010-12-26 NOTE — Procedures (Unsigned)
VASCULAR LAB EXAM  INDICATION:  Follow-up evaluation, status post placement of arteriovenous fistula in the left upper extremity.  History, left brachiocephalic AV fistula.  HISTORY: Diabetes:  Yes. Cardiac: Hypertension:  Yes.  EXAM:  Duplex exam of the left brachiocephalic AV fistula reveals soft plaque at the anastomosis with increased velocities of 728 cm/s.  There is slight narrowing visualized in the distal upper extremity with a velocity of 337 cm/s.  IMPRESSION:  Stenosis in the cephalic vein at the arteriovenous fistula.  ___________________________________________ Janetta Hora. Fields, MD  CI/MEDQ  D:  12/19/2010  T:  12/19/2010  Job:  [id number]

## 2011-05-22 HISTORY — PX: US ECHOCARDIOGRAPHY: HXRAD669

## 2012-06-08 ENCOUNTER — Encounter: Payer: Self-pay | Admitting: Cardiovascular Disease

## 2012-06-09 ENCOUNTER — Other Ambulatory Visit (HOSPITAL_COMMUNITY): Payer: Self-pay | Admitting: *Deleted

## 2012-06-10 ENCOUNTER — Encounter (HOSPITAL_COMMUNITY)
Admission: RE | Admit: 2012-06-10 | Discharge: 2012-06-10 | Disposition: A | Discharge status: Home / Self Care | Payer: Medicare Other | Source: Ambulatory Visit | Attending: Nephrology | Admitting: Nephrology

## 2012-06-10 ENCOUNTER — Encounter: Payer: Self-pay | Admitting: Neurology

## 2012-06-10 ENCOUNTER — Ambulatory Visit (INDEPENDENT_AMBULATORY_CARE_PROVIDER_SITE_OTHER): Payer: Medicare Other | Admitting: Neurology

## 2012-06-10 VITALS — BP 176/79 | HR 77 | Ht 61.0 in | Wt 144.0 lb

## 2012-06-10 DIAGNOSIS — G20 Parkinson's disease: Secondary | ICD-10-CM | POA: Insufficient documentation

## 2012-06-10 DIAGNOSIS — G20A1 Parkinson's disease without dyskinesia, without mention of fluctuations: Secondary | ICD-10-CM | POA: Insufficient documentation

## 2012-06-10 MED ORDER — ROPINIROLE HCL ER 2 MG PO TB24: 4.0000 mg | ORAL_TABLET | Freq: Every day | ORAL | Status: DC

## 2012-06-10 MED ORDER — CARBIDOPA-LEVODOPA 25-100 MG PO TABS: 1.0000 | ORAL_TABLET | Freq: Four times a day (QID) | ORAL | Status: DC

## 2012-06-10 MED ORDER — SODIUM CHLORIDE 0.9 % IV SOLN
1020.0000 mg | Freq: Once | INTRAVENOUS | Status: AC
  Administered 2012-06-10: 1020 mg via INTRAVENOUS
  Filled 2012-06-10: qty 34

## 2012-06-10 NOTE — Progress Notes (Signed)
History of Present Illness:   [name] is a 72 year old black female  accompanied by her husband returns for followup. Last visit was with Eber Jones in 12/11/2011.  She was diagnosed with idiopathic Parkinson's disease since 2007, presenting with bilateral hands tremor, right worse than left,also gradual onset of gait difficulty. she also complains of excessive dreaming,screen from her dream.    She was a patient of Dr. Lissa Morales, she was treated with Sinemet CR  50/200ER at 8:00 AM, 2 PM, 8 PM.  she tolerated medication well, but admit not compliant sometimes,she did notice wearing off, especially when she did not take the medicine on time. 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  Sinemet ER was changed to short acting sinemet 25/100 tid since Sept 2013,  She does very little cooking. Independent with ADL's. She does not exercise.   She also has past medical history of hypertension, diabetes,  She is now taking Sinemet 25/100 mg at 9, 2 PM, 8 PM, she noticed quicker onset time if she take the medicine empty stomach, she denies significant GI side effects, complains of worsening bilateral hands tremor, gait difficulty at the end of first dose.    Physical Exam  Neck: supple no carotid bruits Respiratory: clear to auscultation bilaterally Cardiovascular: regular rate rhythm  Neurologic Exam  Mental Status: pleasant, awake, alert, cooperative to history, talking, and casual conversation. Cranial Nerves: CN II-XII pupils were equal round reactive to light.   Extraocular movements were full.  Visual fields were full on confrontational test.  Facial  sensation and strength were normal.  Hearing was intact to finger rubbing bilaterally.  Uvula tongue were midline.  Head turning and shoulder shrugging were normal and symmetric.  Tongue protrusion into the cheeks strength were normal.  Motor: almost constant bilateral hands resting tremor, cogwheeling rigidity at limbs and nuchal, left worse than right, early fatiguability at wrist opening and closure, difficulty with foot tapping. Sensory: Normal to light touch, pinprick, proprioception, and vibratory sensation. Coordination: Normal finger-to-nose, heel-to-shin.  There was no dysmetria noticed. Gait and Station: moderate stride, mild decreased arm swing, increased bilateral hands tremor, tendency to lean forward, on tiptoe Reflexes: Deep tendon reflexes: Biceps: 2/2, Brachioradialis: 2/2, Triceps: 2/2, Pateller: 2/2, Achilles: trace.  Plantar responses are flexor.   Assessment and Plan: 72 year old female,with idiopathic Parkinson's disease. She has wearing off phenomena  while taking Sinemet 25/100 3 times a day  1, I will increase  Sinemet to 25/100 4 times a day.  2. Add on requip xr 2 mg every night.  3 Moderate exercise 4. RTC in 3 months

## 2012-09-28 ENCOUNTER — Ambulatory Visit (INDEPENDENT_AMBULATORY_CARE_PROVIDER_SITE_OTHER): Payer: Medicare Other | Admitting: Neurology

## 2012-09-28 ENCOUNTER — Encounter: Payer: Self-pay | Admitting: Neurology

## 2012-09-28 VITALS — BP 148/61 | HR 67 | Ht 61.0 in | Wt 143.0 lb

## 2012-09-28 DIAGNOSIS — G20 Parkinson's disease: Secondary | ICD-10-CM

## 2012-09-28 DIAGNOSIS — G20A1 Parkinson's disease without dyskinesia, without mention of fluctuations: Secondary | ICD-10-CM

## 2012-09-28 DIAGNOSIS — N186 End stage renal disease: Secondary | ICD-10-CM

## 2012-09-28 NOTE — Progress Notes (Signed)
History of Present Illness:   Sierra Riley is a 72 year old black female  accompanied by her husband returns for followup. Last visit was June 5th 2014.  She was diagnosed with idiopathic Parkinson's disease since 2007, presenting with bilateral hands tremor, right worse than left,also gradual onset of gait difficulty. she also complains of excessive dreaming,screen from her dream.    She was a patient of Dr. Lissa Morales, she was treated with Sinemet CR  50/200ER at 8:00 AM, 2 PM, 8 PM.  she tolerated medication well, but admit not compliant sometimes,she did notice wearing off, especially when she did not take the medicine on time. 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  Sinemet ER was changed to short acting sinemet 25/100 tid since Sept 2013,  She does very little cooking. Independent with ADL's. She does not exercise.   She also has past medical history of hypertension, diabetes,  She is now taking Sinemet 25/100 mg at 9, 2 PM, 8 PM, she noticed quicker onset time if she take the medicine empty stomach, she denies significant GI side effects, complains of worsening bilateral hands tremor, gait difficulty at the end of first dose.  UPDATE Sept 23rd 2014:   She is now taking Sinemet 25/100 mg 4 times a day, she denies significant side effects, she is also taking Requip XR 2 mg 2 tablets every night, she complains of worsening bilateral feet swelling, which she contributed to the side effect of the medications. She also has history of chronic renal disease.  Physical Exam  Neck: supple no carotid bruits Respiratory: clear to auscultation  bilaterally Cardiovascular: regular rate rhythm  Neurologic Exam  Mental Status: pleasant, awake, alert, cooperative to history, talking, and casual conversation. Cranial Nerves: CN II-XII pupils were equal round reactive to light.   Extraocular movements were full.  Visual fields were full on confrontational test.  Facial sensation and strength were normal.  Hearing was intact to finger rubbing bilaterally.  Uvula tongue were midline.  Head turning and shoulder shrugging were normal and symmetric.  Tongue protrusion into the cheeks strength were normal.  Motor: almost constant bilateral hands resting tremor, cogwheeling rigidity at limbs and nuchal, left worse than right, early fatiguability at wrist opening and closure, difficulty with foot tapping. Sensory: Normal to light touch, pinprick, proprioception, and vibratory sensation. Coordination: Normal finger-to-nose, heel-to-shin.  There was no dysmetria noticed. Gait and Station: moderate stride, mild decreased arm swing, increased bilateral hands tremor, tendency to lean forward, on tiptoe, mild difficulty turning, mild retropulse instability. Reflexes: Deep tendon reflexes: Biceps: 2/2, Brachioradialis: 2/2, Triceps: 2/2, Pateller: 2/2, Achilles: trace.  Plantar responses are flexor.   Assessment and Plan: 72 year-old female,with idiopathic Parkinson's disease.   1. Sinemet to 25/100 4 times a day.  2. Stop requip xr 2 mg every night, call for progress report in 2 weeks, to see if it is the culprit of her leg swellig.  3.  Moderate exercise 4. RTC in 6 months

## 2012-11-26 ENCOUNTER — Other Ambulatory Visit: Payer: Self-pay | Admitting: Internal Medicine

## 2012-11-26 DIAGNOSIS — Z1231 Encounter for screening mammogram for malignant neoplasm of breast: Secondary | ICD-10-CM

## 2012-12-24 ENCOUNTER — Ambulatory Visit
Admission: RE | Admit: 2012-12-24 | Discharge: 2012-12-24 | Disposition: A | Discharge status: Home / Self Care | Payer: Medicare Other | Source: Ambulatory Visit | Attending: Internal Medicine | Admitting: Internal Medicine

## 2012-12-24 DIAGNOSIS — Z1231 Encounter for screening mammogram for malignant neoplasm of breast: Secondary | ICD-10-CM

## 2013-02-23 ENCOUNTER — Telehealth: Payer: Self-pay | Admitting: Neurology

## 2013-02-23 NOTE — Telephone Encounter (Signed)
Pt's husband, Fayrene FearingJames, called.  Stated that Sierra Riley is having problems moving her legs and she is shaking real bad.  It may take her 4-5 minutes after standing before she can start walking.  They are wanting to know if she should start some type of physical therapy to help with her walking or what they should try to do.  Please contact.  Thank you.

## 2013-02-23 NOTE — Telephone Encounter (Signed)
I attempted to call patient's twice without success, I was not able to leave a message either, she is scheduled to see Eber JonesCarolyn in March 4th, will address medication issues then

## 2013-03-28 ENCOUNTER — Ambulatory Visit: Payer: Medicare Other | Admitting: Nurse Practitioner

## 2013-03-29 ENCOUNTER — Ambulatory Visit (INDEPENDENT_AMBULATORY_CARE_PROVIDER_SITE_OTHER): Payer: Medicare Other | Admitting: Nurse Practitioner

## 2013-03-29 ENCOUNTER — Encounter (INDEPENDENT_AMBULATORY_CARE_PROVIDER_SITE_OTHER): Payer: Self-pay

## 2013-03-29 ENCOUNTER — Encounter: Payer: Self-pay | Admitting: Nurse Practitioner

## 2013-03-29 VITALS — BP 216/78 | HR 74 | Ht 61.0 in | Wt 131.0 lb

## 2013-03-29 DIAGNOSIS — R269 Unspecified abnormalities of gait and mobility: Secondary | ICD-10-CM | POA: Insufficient documentation

## 2013-03-29 DIAGNOSIS — N186 End stage renal disease: Secondary | ICD-10-CM

## 2013-03-29 DIAGNOSIS — G20 Parkinson's disease: Secondary | ICD-10-CM

## 2013-03-29 NOTE — Patient Instructions (Signed)
Increase carbodopa levo dopa as instructed Physical therapy to evaluate and treat F/U in 3 months

## 2013-03-29 NOTE — Progress Notes (Signed)
[facility]  PATIENT: Sierra Riley DOB: [birth year]   REASON FOR VISIT: Followup for Parkinson's disease   HISTORY OF PRESENT ILLNESS: Sierra Riley, 73 year old female returns for followup. She has a history of Parkinson's disease and due to excessive fluid retention Requip was discontinued at her last visit with Dr. Terrace Arabia on 09/28/2012. She is now on dialysis since November. Her blood pressure is noted to be elevated today however she did not take her Catapres. She is now taking Sinemet 25/100 4 times daily. She does not take it on a regular basis. She complains of worsening tremor as well as more gait difficulty and freezing spells. She has not fallen but has had near falls. She is using a rolling walker She returns today with her husband. Appetite is reportedly good and she sleeps well most nights. She gets no regular exercise and she does not do a home exercise program.    HISTORY: She was diagnosed with idiopathic Parkinson's disease since 2007, presenting with bilateral hands tremor, right worse than left,also gradual onset of gait difficulty. she also complains of excessive dreaming,screen from her dream.  She was a patient of Dr. Lissa Morales, she was treated with Sinemet CR 50/200ER at 8:00 AM, 2 PM, 8 PM. she tolerated medication well, but admit not compliant sometimes,she did notice wearing off, especially when she did not take the medicine on time. 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  Sinemet ER was changed to short acting sinemet 25/100 tid since Sept 2013, She does very little cooking. Independent with ADL's. She does not  exercise.  She also has past medical history of hypertension, diabetes,  She is now taking Sinemet 25/100 mg at 9, 2 PM, 8 PM, she noticed quicker onset time if she take the medicine empty stomach, she denies significant GI side effects, complains of worsening bilateral hands tremor, gait difficulty at the end of first dose.  UPDATE Sept 23rd 2014:  She is now taking Sinemet 25/100 mg 4 times a day, she denies significant side effects, she is also taking Requip XR 2 mg 2 tablets every night, she complains of worsening bilateral feet swelling, which she contributed to the side effect of the medications. She also has history of chronic renal disease.   REVIEW OF SYSTEMS: Full 14 system review of systems performed and notable only for those listed, all others are neg:  Constitutional: Fatigue  Cardiovascular: N/A  Ear/Nose/Throat: N/A  Skin: N/A  Eyes: N/A  Respiratory: N/A  Gastroitestinal: Constipation Hematology/Lymphatic: N/A  Endocrine: N/A Musculoskeletal: Joint pain, muscle cramps, walking difficulty Allergy/Immunology: N/A  Neurological: N/A Psychiatric: N/A   ALLERGIES: No Known Allergies  HOME MEDICATIONS: Outpatient Prescriptions Prior to Visit  Medication Sig Dispense Refill  . amLODipine (NORVASC) 5 MG tablet Take 10 mg by mouth daily.       Marland Kitchen aspirin 81 MG tablet Take 81 mg by mouth daily.        . carbidopa-levodopa (SINEMET IR) 25-100 MG per tablet Take 1 tablet by mouth 4 (four) times daily.  120 tablet  12  . cloNIDine (CATAPRES) 0.3 MG tablet Take 1 tablet by mouth 2 (  two) times daily.       . CRESTOR 10 MG tablet Take 10 mg by mouth daily.      Marland Kitchen ezetimibe-simvastatin (VYTORIN) 10-80 MG per tablet Take 1 tablet by mouth at bedtime.        . insulin aspart protamine-insulin aspart (NOVOLOG 70/30) (70-30) 100 UNIT/ML injection Inject 12 Units into the skin 2 (two) times daily.       . metoprolol (LOPRESSOR) 50 MG tablet Take 50 mg by mouth. Take 2 tabs in am and 1  tab in pm      . rOPINIRole (REQUIP XL) 2 MG 24 hr tablet Take 2 tablets (4 mg total) by mouth at bedtime. Take one tab po qhs xone week, then  60 tablet  12  . furosemide (LASIX) 40 MG tablet Take 40 mg by mouth 2 (two) times daily.         No facility-administered medications prior to visit.    PAST MEDICAL HISTORY: Past Medical History  Diagnosis Date  . Diabetes mellitus   . Hypertension   . Hyperlipidemia   . CAD (coronary artery disease)   . Parkinson's disease   . Peripheral vascular disease, unspecified   . Chronic kidney disease   . Renal artery stenosis     PAST SURGICAL HISTORY: Past Surgical History  Procedure Laterality Date  . Av fistula placement  08/27/2010    left B-C AVF    FAMILY HISTORY: Family History  Problem Relation Age of Onset  . Kidney disease Mother   . Hypertension Mother   . Heart disease Father   . Other Brother     sarcoidosis    SOCIAL HISTORY: History   Social History  . Marital Status: Married    Spouse Name: [name]    Number of Children: 4  . Years of Education: 12   Occupational History  .      retired   Social History Main Topics  . Smoking status: Former Smoker -- 0.30 packs/day for 10 years    Types: Cigarettes    Quit date: 07/18/1999  . Smokeless tobacco: Never Used  . Alcohol Use: No  . Drug Use: No  . Sexual Activity: Not on file   Other Topics Concern  . Not on file   Social History Narrative   Patient lives at home with her husband [name]). Patient is retired, [employer].   Patient 1/2 cup of caffeine daily.   Right handed.     PHYSICAL EXAM  Filed Vitals:   03/29/13 1039  BP: 216/78  Pulse: 74  Height: 5\' 1"  (1.549 m)  Weight: 131 lb (59.421 kg)   Body mass index is 24.76 kg/(m^2).  Generalized: Well developed, in no acute distress , masking of the face Head: normocephalic and atraumatic,. Oropharynx benign  Neck: Supple, no carotid bruits  Cardiac: Regular rate rhythm, no murmur   Musculoskeletal: No deformity   Neurological examination   Mentation: Alert oriented to time, place, history taking. Follows all commands speech and language fluent  Cranial nerve II-XII: Pupils were equal round reactive to light extraocular movements were full, visual field were full on confrontational test. Facial sensation and strength were normal. hearing was intact to finger rubbing bilaterally. Uvula tongue midline. head turning and shoulder shrug were normal and symmetric.Tongue protrusion into cheek strength was normal. Motor: normal bulk and tone, full strength in the BUE, BLE, fine finger, constant bilateral resting tremor, cogwheeling at rest and elbows left worse than right,  mild bradykinesia  Coordination: finger-nose-finger, heel-to-shin bilaterally, no dysmetria Reflexes: Brachioradialis 2/2, biceps 2/2, triceps 2/2, patellar 2/2, Achilles trace plantar responses were flexor bilaterally. Gait and Station: Rising up from seated position without assistance, wide based, ambulate with a rolling walker, freezing spells upon turning mild retropulsion instability  DIAGNOSTIC DATA (LABS, IMAGING, TESTING) -   ASSESSMENT AND PLAN  73 y.o. year old female  has a past medical history of Diabetes mellitus; Hypertension; Hyperlipidemia; CAD (coronary artery disease); Parkinson's disease; Peripheral vascular disease, unspecified; Chronic kidney disease; and Renal artery stenosis. here to followup with Parkinson's disease.  Increase carbodopa levo dopa as instructed 1.5, 1.5 then 1 and 1 Physical therapy to evaluate and treat, neuro rehab F/U in 3 months Nilda RiggsNancy Carolyn Martin, [facility] Medical[facility]NP, [facility], APRN  [facility] [address], [address] [place], [place] [address] [phone]

## 2013-05-04 ENCOUNTER — Ambulatory Visit (INDEPENDENT_AMBULATORY_CARE_PROVIDER_SITE_OTHER): Payer: Medicare Other | Admitting: Podiatry

## 2013-05-04 ENCOUNTER — Encounter: Payer: Self-pay | Admitting: Podiatry

## 2013-05-04 VITALS — Ht 61.0 in | Wt 130.0 lb

## 2013-05-04 DIAGNOSIS — B351 Tinea unguium: Secondary | ICD-10-CM

## 2013-05-04 DIAGNOSIS — R0989 Other specified symptoms and signs involving the circulatory and respiratory systems: Secondary | ICD-10-CM

## 2013-05-04 DIAGNOSIS — L84 Corns and callosities: Secondary | ICD-10-CM

## 2013-05-04 DIAGNOSIS — M79609 Pain in unspecified limb: Secondary | ICD-10-CM

## 2013-05-04 NOTE — Progress Notes (Signed)
   Subjective:    Patient ID: Sierra Riley, female    DOB: Aug 17, 1940, 73 y.o.   MRN: [id number]  HPI Comments: N skin change L left medial heel D 6 months O C Darkened, peeling area A sitting position or old shoes T cream for debriding  Pt request trimming of 1 - 10 toenails, and left 5th toe corn, and diabetic foot exam.  Diabetes   Patient's husband relates possible arterial examination of his wife within the last 24 months.   Review of Systems  Genitourinary:       Dialysis pt  All other systems reviewed and are negative.      Objective:   Physical Exam  A 73 year old black female appears orientated x3 presents with her husband.  Vascular: DP and PT pulses 0/4 bilaterally.  Neurological: Patient has resting tremors upper extremities. Ankle reflexes nonreactive bilaterally Vibratory sensation intact bilaterally Sensation to 10 g monofilament wire intact 5/5 right and 4/5 left.  Dermatological: Well-organized hemorrhagic callus on the medial left heel noted. There is no surrounding erythema, edema around this area on the left heel. This callus area is well-organized and does not appear to be inflamed. A well-organized keratoses noted in the fifth left toe nail groove area.  The toenails are elongated, hypertrophic, discolored x10  Musculoskeletal: No deformities noted. Unsteady gait pattern noted          Assessment & Plan:   Assessment: Diminished pedal pulses bilaterally suggesting peripheral arterial disease bilaterally Protective sensation is intact bilaterally Gait disturbance bilaterally Symptomatic onychomycoses x10 Keratoses x1 Pressure keratoses x1 on left heel  Plan: Nails x10 and keratoses fifth left debrided without any bleeding. Patient advised to purchase heel protector to [facility]. In addition, I recommended that when patient is in a seated position to place a pillow behind the calf area of her left leg to  elevate the heel.  Schedule a lower extremity arterial Doppler examination for the indication of decreased pedal pulses/diabetes  notify patient on receipt of arterial examination  Schedule patient for three-month followup for debridement of toenails and keratoses.

## 2013-05-04 NOTE — Patient Instructions (Signed)
The vascular lab will contact you to schedule an appointment for a lower extremity arterial Doppler to check the circulation and your legs and feet  Diabetes and Foot Care Diabetes may cause you to have problems because of poor blood supply (circulation) to your feet and legs. This may cause the skin on your feet to become thinner, break easier, and heal more slowly. Your skin may become dry, and the skin may peel and crack. You may also have nerve damage in your legs and feet causing decreased feeling in them. You may not notice minor injuries to your feet that could lead to infections or more serious problems. Taking care of your feet is one of the most important things you can do for yourself.  HOME CARE INSTRUCTIONS  Wear shoes at all times, even in the house. Do not go barefoot. Bare feet are easily injured.  Check your feet daily for blisters, cuts, and redness. If you cannot see the bottom of your feet, use a mirror or ask someone for help.  Wash your feet with warm water (do not use hot water) and mild soap. Then pat your feet and the areas between your toes until they are completely dry. Do not soak your feet as this can dry your skin.  Apply a moisturizing lotion or petroleum jelly (that does not contain alcohol and is unscented) to the skin on your feet and to dry, brittle toenails. Do not apply lotion between your toes.  Trim your toenails straight across. Do not dig under them or around the cuticle. File the edges of your nails with an emery board or nail file.  Do not cut corns or calluses or try to remove them with medicine.  Wear clean socks or stockings every day. Make sure they are not too tight. Do not wear knee-high stockings since they may decrease blood flow to your legs.  Wear shoes that fit properly and have enough cushioning. To break in new shoes, wear them for just a few hours a day. This prevents you from injuring your feet. Always look in your shoes before you put  them on to be sure there are no objects inside.  Do not cross your legs. This may decrease the blood flow to your feet.  If you find a minor scrape, cut, or break in the skin on your feet, keep it and the skin around it clean and dry. These areas may be cleansed with mild soap and water. Do not cleanse the area with peroxide, alcohol, or iodine.  When you remove an adhesive bandage, be sure not to damage the skin around it.  If you have a wound, look at it several times a day to make sure it is healing.  Do not use heating pads or hot water bottles. They may burn your skin. If you have lost feeling in your feet or legs, you may not know it is happening until it is too late.  Make sure your health care provider performs a complete foot exam at least annually or more often if you have foot problems. Report any cuts, sores, or bruises to your health care provider immediately. SEEK MEDICAL CARE IF:   You have an injury that is not healing.  You have cuts or breaks in the skin.  You have an ingrown nail.  You notice redness on your legs or feet.  You feel burning or tingling in your legs or feet.  You have pain or cramps in your legs  and feet.  Your legs or feet are numb.  Your feet always feel cold. SEEK IMMEDIATE MEDICAL CARE IF:   There is increasing redness, swelling, or pain in or around a wound.  There is a red line that goes up your leg.  Pus is coming from a wound.  You develop a fever or as directed by your health care provider.  You notice a bad smell coming from an ulcer or wound. Document Released: 12/21/1999 Document Revised: 08/25/2012 Document Reviewed: 06/01/2012 [facility] Patient Information 2014 [place].

## 2013-05-05 ENCOUNTER — Encounter: Payer: Self-pay | Admitting: Podiatry

## 2013-05-06 ENCOUNTER — Telehealth (HOSPITAL_COMMUNITY): Payer: Self-pay | Admitting: *Deleted

## 2013-05-11 ENCOUNTER — Ambulatory Visit (HOSPITAL_COMMUNITY)
Admission: RE | Admit: 2013-05-11 | Discharge: 2013-05-11 | Disposition: A | Discharge status: Home / Self Care | Payer: Medicare Other | Source: Ambulatory Visit | Attending: Cardiovascular Disease | Admitting: Cardiovascular Disease

## 2013-05-11 DIAGNOSIS — R0989 Other specified symptoms and signs involving the circulatory and respiratory systems: Secondary | ICD-10-CM

## 2013-05-11 NOTE — Progress Notes (Signed)
Lower Extremity Arterial Duplex Completed. °Brianna L Mazza,RVT °

## 2013-05-13 ENCOUNTER — Ambulatory Visit: Payer: Medicare Other | Attending: Nurse Practitioner | Admitting: Physical Therapy

## 2013-05-13 DIAGNOSIS — G20 Parkinson's disease: Secondary | ICD-10-CM | POA: Insufficient documentation

## 2013-05-13 DIAGNOSIS — R293 Abnormal posture: Secondary | ICD-10-CM | POA: Insufficient documentation

## 2013-05-13 DIAGNOSIS — IMO0001 Reserved for inherently not codable concepts without codable children: Secondary | ICD-10-CM | POA: Insufficient documentation

## 2013-05-13 DIAGNOSIS — G20A1 Parkinson's disease without dyskinesia, without mention of fluctuations: Secondary | ICD-10-CM | POA: Insufficient documentation

## 2013-05-13 DIAGNOSIS — R269 Unspecified abnormalities of gait and mobility: Secondary | ICD-10-CM | POA: Insufficient documentation

## 2013-05-17 ENCOUNTER — Ambulatory Visit: Payer: Medicare Other | Admitting: Physical Therapy

## 2013-05-17 DIAGNOSIS — IMO0001 Reserved for inherently not codable concepts without codable children: Secondary | ICD-10-CM | POA: Diagnosis not present

## 2013-05-24 ENCOUNTER — Ambulatory Visit: Payer: Medicare Other | Admitting: Physical Therapy

## 2013-05-24 DIAGNOSIS — IMO0001 Reserved for inherently not codable concepts without codable children: Secondary | ICD-10-CM | POA: Diagnosis not present

## 2013-05-25 ENCOUNTER — Telehealth: Payer: Self-pay | Admitting: *Deleted

## 2013-05-25 DIAGNOSIS — I739 Peripheral vascular disease, unspecified: Secondary | ICD-10-CM

## 2013-05-25 NOTE — Telephone Encounter (Signed)
Message copied by Enedina FinnerMEADOWS, Lilly Gasser J on Wed May 25, 2013  9:00 AM ------      Message from: Carrington ClampUCHMAN, RICHARD C      Created: Thu May 19, 2013 12:19 PM       Results of lower extremity arterial Doppler dated 05/11/2013            Summary: Is an abnormal lower extremity arterial duplex Doppler            Contact patient and Dr. Gery PrayBarry for followup evaluation. ------

## 2013-05-25 NOTE — Telephone Encounter (Signed)
I called and informed the patient that Dr. Leeanne Deeduchman was referring her to Dr. Allyson SabalBerry due to abnormal doppler study.  I told her they would be calling her to schedule the appointment.  He is at the same location where the doppler was performed.  She stated okay.

## 2013-05-27 ENCOUNTER — Ambulatory Visit: Payer: Medicare Other | Admitting: Physical Therapy

## 2013-05-27 DIAGNOSIS — IMO0001 Reserved for inherently not codable concepts without codable children: Secondary | ICD-10-CM | POA: Diagnosis not present

## 2013-06-06 ENCOUNTER — Ambulatory Visit: Payer: 59 | Admitting: Physical Therapy

## 2013-06-07 ENCOUNTER — Ambulatory Visit: Payer: Medicare Other | Attending: Nurse Practitioner | Admitting: Physical Therapy

## 2013-06-07 DIAGNOSIS — IMO0001 Reserved for inherently not codable concepts without codable children: Secondary | ICD-10-CM | POA: Insufficient documentation

## 2013-06-09 ENCOUNTER — Ambulatory Visit: Payer: Medicare Other | Admitting: Physical Therapy

## 2013-06-09 DIAGNOSIS — IMO0001 Reserved for inherently not codable concepts without codable children: Secondary | ICD-10-CM | POA: Diagnosis not present

## 2013-06-14 ENCOUNTER — Ambulatory Visit: Payer: Medicare Other | Admitting: Physical Therapy

## 2013-06-14 DIAGNOSIS — IMO0001 Reserved for inherently not codable concepts without codable children: Secondary | ICD-10-CM | POA: Diagnosis not present

## 2013-06-17 ENCOUNTER — Ambulatory Visit: Payer: Medicare Other | Admitting: Rehabilitative and Restorative Service Providers"

## 2013-06-17 ENCOUNTER — Other Ambulatory Visit: Payer: Self-pay | Admitting: Neurology

## 2013-06-17 DIAGNOSIS — IMO0001 Reserved for inherently not codable concepts without codable children: Secondary | ICD-10-CM | POA: Diagnosis not present

## 2013-06-21 ENCOUNTER — Ambulatory Visit: Payer: Medicare Other | Admitting: Physical Therapy

## 2013-06-21 DIAGNOSIS — IMO0001 Reserved for inherently not codable concepts without codable children: Secondary | ICD-10-CM | POA: Diagnosis not present

## 2013-06-22 ENCOUNTER — Telehealth: Payer: Self-pay | Admitting: Neurology

## 2013-06-22 NOTE — Telephone Encounter (Signed)
Feedback from her physical therapist Lonia Bloodmy Marriott, the patient has significant off time noticed, bilateral upper extremity tremor, freezing episodes with gait,  In her followup visit with Eber Jonesarolyn in July, will verify her medications, may consider adjustment

## 2013-07-01 ENCOUNTER — Encounter: Payer: Self-pay | Admitting: Cardiovascular Disease

## 2013-07-01 ENCOUNTER — Ambulatory Visit (INDEPENDENT_AMBULATORY_CARE_PROVIDER_SITE_OTHER): Payer: Medicare Other | Admitting: Cardiovascular Disease

## 2013-07-01 VITALS — BP 142/56 | HR 73 | Ht 61.0 in | Wt 130.0 lb

## 2013-07-01 DIAGNOSIS — E785 Hyperlipidemia, unspecified: Secondary | ICD-10-CM

## 2013-07-01 DIAGNOSIS — I739 Peripheral vascular disease, unspecified: Secondary | ICD-10-CM | POA: Insufficient documentation

## 2013-07-01 DIAGNOSIS — I2583 Coronary atherosclerosis due to lipid rich plaque: Secondary | ICD-10-CM

## 2013-07-01 DIAGNOSIS — I10 Essential (primary) hypertension: Secondary | ICD-10-CM

## 2013-07-01 DIAGNOSIS — E119 Type 2 diabetes mellitus without complications: Secondary | ICD-10-CM | POA: Insufficient documentation

## 2013-07-01 DIAGNOSIS — I2510 Atherosclerotic heart disease of native coronary artery without angina pectoris: Secondary | ICD-10-CM | POA: Insufficient documentation

## 2013-07-01 DIAGNOSIS — Z9861 Coronary angioplasty status: Secondary | ICD-10-CM

## 2013-07-01 DIAGNOSIS — I701 Atherosclerosis of renal artery: Secondary | ICD-10-CM

## 2013-07-01 MED ORDER — METOPROLOL TARTRATE 50 MG PO TABS: 50.0000 mg | ORAL_TABLET | Freq: Two times a day (BID) | ORAL | Status: DC

## 2013-07-01 NOTE — Assessment & Plan Note (Signed)
History of balloon angioplasty of her RCA as well as stenting of her LAD and circumflex by Dr. Alanda AmassWeintraub 07/05/99. She has normal LV function. Her last Myoview performed 10/08/10 was nonischemic. She denies chest pain or shortness of breath.

## 2013-07-01 NOTE — Assessment & Plan Note (Signed)
Controlled on current medications 

## 2013-07-01 NOTE — Assessment & Plan Note (Signed)
On statin therapy followed by her PCP 

## 2013-07-01 NOTE — Assessment & Plan Note (Signed)
Patient has 60% right renal artery stenosis by angiography which I performed 04/02/10. I do not think this was physiologically significant

## 2013-07-01 NOTE — Assessment & Plan Note (Signed)
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 The patient really denies claudication and is minimally ambulatory. She does have Parkinson's disease.

## 2013-07-01 NOTE — Progress Notes (Signed)
07/01/2013 Waynette ButteryJoyce P Schreier   10-23-1940  [id number]  Primary Physician Gaspar GarbeISOVEC,RICHARD W, MD Primary Cardiologist: Runell GessJonathan J. Berry MD Roseanne RenoFACP,FACC,FAHA, FSCAI   HPI:  [name] is a 73 year old appearing married African American female mother of 4, grandmother of 9 grandchildren he was formally a patient Dr. Kandis CockingWeintraub's. I'm assuming her care. Her primary care physician is Dr. Sande Brotherssang back. Her history is remarkable for CAD status post intervention by Dr. Alanda AmassWeintraub 07/05/99 on all 3 coronary arteries with stenting of her LAD and circumflex. She has normal LV function and a negative Myoview 3 years ago. Factors include remote tobacco abuse having quit in 2001, 2 hypertension, diabetes and hyperlipidemia. She does have chronic renal insufficiency on hemodialysis since last December. She also has known occluded SFA study with ultrasound was minimally ambulatory and denies claudication.   Current Outpatient Prescriptions  Medication Sig Dispense Refill  . amLODipine (NORVASC) 5 MG tablet Take 10 mg by mouth daily.       Marland Kitchen. aspirin 81 MG tablet Take 81 mg by mouth daily.        . carbidopa-levodopa (SINEMET IR) 25-100 MG per tablet Take 1 tablet by mouth 4 (four) times daily. 1.5 tabs 7am, 1.5 12n, 1 tab at 4pm 1 tab at 8pm      . carbidopa-levodopa (SINEMET IR) 25-100 MG per tablet TAKE ONE TABLET BY MOUTH 4 TIMES DAILY  120 tablet  0  . cloNIDine (CATAPRES) 0.3 MG tablet Take 1 tablet by mouth 2 (two) times daily.       Marland Kitchen. ezetimibe-simvastatin (VYTORIN) 10-80 MG per tablet Take 1 tablet by mouth at bedtime.        . insulin aspart protamine-insulin aspart (NOVOLOG 70/30) (70-30) 100 UNIT/ML injection Inject 12 Units into the skin 2 (two) times daily.       . metoprolol (LOPRESSOR) 50 MG tablet Take 1 tablet (50 mg total) by mouth 2 (two) times daily.  180 tablet  3  . NOVOFINE 32G X 6 MM MISC        No current facility-administered medications for this visit.    No Known Allergies  History     Social History  . Marital Status: Married    Spouse Name: Fayrene FearingJames    Number of Children: 4  . Years of Education: 12   Occupational History  .      retired   Social History Main Topics  . Smoking status: Former Smoker -- 0.30 packs/day for 10 years    Types: Cigarettes    Quit date: 07/18/1999  . Smokeless tobacco: Never Used  . Alcohol Use: No  . Drug Use: No  . Sexual Activity: Not on file   Other Topics Concern  . Not on file   Social History Narrative   Patient lives at home with her husband [name](James). Patient is retired, [employer] education.   Patient 1/2 cup of caffeine daily.   Right handed.     Review of Systems: General: negative for chills, fever, night sweats or weight changes.  Cardiovascular: negative for chest pain, dyspnea on exertion, edema, orthopnea, palpitations, paroxysmal nocturnal dyspnea or shortness of breath Dermatological: negative for rash Respiratory: negative for cough or wheezing Urologic: negative for hematuria Abdominal: negative for nausea, vomiting, diarrhea, bright red blood per rectum, melena, or hematemesis Neurologic: negative for visual changes, syncope, or dizziness All other systems reviewed and are otherwise negative except as noted above.    Blood pressure 142/56, pulse 73, height 5'  1" (1.549 m), weight 130 lb (58.968 kg).  General appearance: alert and no distress Neck: no adenopathy, no JVD, supple, symmetrical, trachea midline, thyroid not enlarged, symmetric, no tenderness/mass/nodules and loud to-and-fro bruit left carotid probably related to her AV fistula on that side Lungs: clear to auscultation bilaterally Heart: regular rate and rhythm, S1, S2 normal, no murmur, click, rub or gallop Extremities: extremities normal, atraumatic, no cyanosis or edema  EKG normal sinus rhythm at 73 without ST or T wave changes  ASSESSMENT AND PLAN:   Renal artery stenosis Patient has 60% right renal artery stenosis by angiography  which I performed 04/02/10. I do not think this was physiologically significant  Hyperlipidemia On statin therapy followed by her PCP  Essential hypertension Controlled on current medications  Coronary artery disease History of balloon angioplasty of her RCA as well as stenting of her LAD and circumflex by Dr. Alanda AmassWeintraub 07/05/99. She has normal LV function. Her last Myoview performed 10/08/10 was nonischemic. She denies chest pain or shortness of breath.  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 The patient really denies claudication and is minimally ambulatory. She does have Parkinson's disease.      Runell GessJonathan J. Berry MD FACP,FACC,FAHA, [facility] Pa[facility]SCAI 07/01/2013 11:07 AM

## 2013-07-01 NOTE — Patient Instructions (Signed)
We request that you follow-up in: 6 months with an extender and in 1 year with Dr Berry  You will receive a reminder letter in the mail two months in advance. If you don't receive a letter, please call our office to schedule the follow-up appointment.   

## 2013-07-13 ENCOUNTER — Ambulatory Visit: Payer: Medicare Other | Admitting: Nurse Practitioner

## 2013-07-18 ENCOUNTER — Ambulatory Visit (INDEPENDENT_AMBULATORY_CARE_PROVIDER_SITE_OTHER): Payer: Medicare Other | Admitting: Neurology

## 2013-07-18 ENCOUNTER — Encounter: Payer: Self-pay | Admitting: Neurology

## 2013-07-18 VITALS — BP 142/57 | HR 68 | Ht 61.0 in | Wt 134.0 lb

## 2013-07-18 DIAGNOSIS — I2510 Atherosclerotic heart disease of native coronary artery without angina pectoris: Secondary | ICD-10-CM

## 2013-07-18 DIAGNOSIS — I2584 Coronary atherosclerosis due to calcified coronary lesion: Secondary | ICD-10-CM

## 2013-07-18 DIAGNOSIS — G20 Parkinson's disease: Secondary | ICD-10-CM

## 2013-07-18 MED ORDER — CARBIDOPA-LEVODOPA 25-100 MG PO TABS: ORAL_TABLET | ORAL | Status: DC

## 2013-07-18 NOTE — Progress Notes (Signed)
[facility]  PATIENT: Sierra Riley DOB: 05/31/1940   REASON FOR VISIT: Followup for Parkinson's disease   HISTORY OF PRESENT ILLNESS:  Sierra Riley was diagnosed with idiopathic Parkinson's disease since 2007, presenting with bilateral hands tremor, right worse than left,also gradual onset of gait difficulty. she also complains of excessive dreaming,screen from her dream.  She was a patient of Dr. Lissa Morales, she was treated with Sinemet CR 50/200ER at 8:00 AM, 2 PM, 8 PM. she tolerated medication well, but admit not compliant sometimes,she did notice wearing off, especially when she did not take the medicine on time. 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  Sinemet ER was changed to short acting sinemet 25/100 tid since Sept 2013, She does very little cooking. Independent with ADL's. She does not exercise.  She also has past medical history of hypertension, diabetes,  She is now taking Sinemet 25/100 mg at 9, 2 PM, 8 PM, she noticed quicker onset time if she take the medicine empty stomach, she denies significant GI side effects, complains of worsening bilateral hands tremor, gait difficulty at the end of first dose.   She has a history of fluid retention taking Requip was discontinued since 09/28/2012. She is now on dialysis since November 2014.  Marland Kitchen She is now taking Sinemet 25/100 4 times daily. She does not take it on a regular basis. She complains of worsening tremor as well as more gait difficulty and freezing spells before next dose.      UPDATE July 18 2013: She has HD T/Thu/Sat 11:30am till 4pm since 11/2012, she is  taking sinemet 25/100, 7:30am, 4pm, 6pm, 8pm. But often missed her dose on the day of HD, because she spend a lot of time in HD units.   REVIEW OF SYSTEMS: Full 14 system review of systems performed and notable only for those listed, all others are neg: Hearing loss, ringing ears, frequent awakening, depression, anxiety, memory loss   ALLERGIES: No Known Allergies  HOME MEDICATIONS: Outpatient Prescriptions Prior to Visit  Medication Sig Dispense Refill  . amLODipine (NORVASC) 5 MG tablet Take 10 mg by mouth daily.       Marland Kitchen aspirin 81 MG tablet Take 81 mg by mouth daily.        . carbidopa-levodopa (SINEMET IR) 25-100 MG per tablet Take 1 tablet by mouth 4 (four) times daily. 1.5 tabs 7am, 1.5 12n, 1 tab at 4pm 1 tab at 8pm      . carbidopa-levodopa (SINEMET IR) 25-100 MG per tablet TAKE ONE TABLET BY MOUTH 4 TIMES DAILY  120 tablet  0  . cloNIDine (CATAPRES) 0.3 MG tablet Take 1 tablet by mouth 2 (two) times daily.       Marland Kitchen ezetimibe-simvastatin (VYTORIN) 10-80 MG per tablet Take 1 tablet by mouth at bedtime.        . insulin aspart protamine-insulin aspart (NOVOLOG 70/30) (70-30) 100 UNIT/ML injection Inject 12 Units into the skin 2 (two) times daily.       . metoprolol (LOPRESSOR) 50 MG tablet Take 1 tablet (50 mg total) by mouth 2 (two) times daily.  180 tablet  3  .  NOVOFINE 32G X 6 MM MISC        No facility-administered medications prior to visit.    PAST MEDICAL HISTORY: Past Medical History  Diagnosis Date  . Diabetes mellitus   . Hypertension   . Hyperlipidemia   . CAD (coronary artery disease)   . Parkinson's disease   . Peripheral vascular disease, unspecified   . Chronic kidney disease     started dialysis 12/07/12  . Renal artery stenosis     PAST SURGICAL HISTORY: Past Surgical History  Procedure Laterality Date  . Av fistula placement  08/27/2010    left B-C AVF    FAMILY HISTORY: Family History  Problem Relation Age of Onset  . Kidney disease Mother   .  Hypertension Mother   . Heart disease Father   . Other Brother     sarcoidosis    SOCIAL HISTORY: History   Social History  . Marital Status: Married    Spouse Name: Fayrene FearingJames    Number of Children: 4  . Years of Education: 12   Occupational History  .      retired   Social History Main Topics  . Smoking status: Former Smoker -- 0.30 packs/day for 10 years    Types: Cigarettes    Quit date: 07/18/1999  . Smokeless tobacco: Never Used  . Alcohol Use: No  . Drug Use: No  . Sexual Activity: Not on file   Other Topics Concern  . Not on file   Social History Narrative   Patient lives at home with her husband [name](James). Patient is retired, [employer] education.   Patient 1/2 cup of caffeine daily.   Right handed.     PHYSICAL EXAM  Filed Vitals:   07/18/13 0912  BP: 142/57  Pulse: 68  Height: 5\' 1"  (1.549 m)  Weight: 134 lb (60.782 kg)   Body mass index is 25.33 kg/(m^2).  Generalized: Well developed, in no acute distress , masking of the face Head: normocephalic and atraumatic,. Oropharynx benign  Neck: Supple, no carotid bruits  Cardiac: Regular rate rhythm, no murmur  Musculoskeletal: No deformity   Neurological examination   Mentation: Alert oriented to time, place, history taking. Follows all commands speech and language fluent  Cranial nerve II-XII: Pupils were equal round reactive to light extraocular movements were full, visual field were full on confrontational test. Facial sensation and strength were normal. hearing was intact to finger rubbing bilaterally. Uvula tongue midline. head turning and shoulder shrug were normal and symmetric.Tongue protrusion into cheek strength was normal. Motor: normal bulk and tone, full strength in the BUE, BLE, fine finger, constant bilateral resting tremor, cogwheeling at rest and elbows left worse than right,  mild bradykinesia  Coordination: finger-nose-finger, heel-to-shin bilaterally, no dysmetria Reflexes:  Brachioradialis 2/2, biceps 2/2, triceps 2/2, patellar 2/2, Achilles trace plantar responses were flexor bilaterally. Gait and Station: Rising up from seated position without assistance, wide based, ambulate with a rolling walker, freezing spells upon turning mild retropulsion instability  DIAGNOSTIC DATA (LABS, IMAGING, TESTING)  ASSESSMENT AND PLAN  73 y.o. year old female  has a past medical history of Diabetes mellitus; Hypertension; Hyperlipidemia; CAD (coronary artery disease); Parkinson's disease; Peripheral vascular disease, unspecified; Chronic kidney disease; and Renal artery stenosis. here to followup with Parkinson's disease.  Keep carbodopa levo dopa as instructed 1.5, 1.5 then 1 and 1, but on dialysis day, she should take 3 times a day, at 8:00, (1.5) 4 PM (1.5), 8 PM(1) F/U in 4- 6  months with [facility] [address], [address] [place], [place] [address] [phone]

## 2013-08-10 ENCOUNTER — Encounter: Payer: Self-pay | Admitting: Podiatry

## 2013-08-10 ENCOUNTER — Ambulatory Visit (INDEPENDENT_AMBULATORY_CARE_PROVIDER_SITE_OTHER): Payer: Medicare Other | Admitting: Podiatry

## 2013-08-10 VITALS — BP 142/74 | HR 86 | Resp 12

## 2013-08-10 DIAGNOSIS — M79609 Pain in unspecified limb: Secondary | ICD-10-CM

## 2013-08-10 DIAGNOSIS — M79673 Pain in unspecified foot: Secondary | ICD-10-CM

## 2013-08-10 DIAGNOSIS — B351 Tinea unguium: Secondary | ICD-10-CM

## 2013-08-10 DIAGNOSIS — Q828 Other specified congenital malformations of skin: Secondary | ICD-10-CM

## 2013-08-11 NOTE — Progress Notes (Signed)
Patient ID: Waynette ButteryJoyce P Granderson, female   DOB: 09-Dec-1940, 73 y.o.   MRN: [id number]  Subjective: This patient presents today complaining of painful toenails and painful nucleated keratoses on the left foot This patient has had an arterial Doppler which was evaluated by Dr. Nanetta BattyJonathan Berry.  Objective: The toenails are elongated, incurvated, discolored, hypertrophic 6-10 Nucleated keratoses lateral fifth left toe Diffuse nonnucleated keratoses plantar left heel  Assessment: Peripheral arterial disease under the management of Dr. 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

## 2013-11-02 ENCOUNTER — Telehealth (HOSPITAL_COMMUNITY): Payer: Self-pay | Admitting: *Deleted

## 2013-11-16 ENCOUNTER — Ambulatory Visit: Payer: Medicare Other | Admitting: Podiatry

## 2013-11-23 ENCOUNTER — Ambulatory Visit (INDEPENDENT_AMBULATORY_CARE_PROVIDER_SITE_OTHER): Payer: Medicare Other | Admitting: Podiatry

## 2013-11-23 ENCOUNTER — Encounter: Payer: Self-pay | Admitting: Neurology

## 2013-11-23 ENCOUNTER — Encounter: Payer: Self-pay | Admitting: Podiatry

## 2013-11-23 DIAGNOSIS — M79676 Pain in unspecified toe(s): Secondary | ICD-10-CM

## 2013-11-23 DIAGNOSIS — L84 Corns and callosities: Secondary | ICD-10-CM

## 2013-11-23 DIAGNOSIS — B351 Tinea unguium: Secondary | ICD-10-CM

## 2013-11-23 NOTE — Progress Notes (Signed)
Patient ID: Sierra Riley, female   DOB: December 07, 1940, 73 y.o.   MRN: [id number]  Subjective: This patient presents again complaining of painful toenails and keratoses. Patient had abnormal arterial Doppler which is evaluated by Dr. Nanetta BattyJonathan Berry  Objective: The toenails are hypertrophic, elongated, discolored 6-10 Listers corn fifth left toe Hemorrhagic keratoses posterior left heel  Assessment: Diabetic with peripheral arterial disease under the management Dr. Nanetta BattyJonathan Berry Symptomatic onychomycoses 6-10 Pre-ulcerative keratoses posterior left heel  Plan: Nails 10 and keratoses 2 debrided without a bleeding  [facility] heel protector for the left heel for the indication of pre-ulcerative callus  Reappoint 3 months

## 2013-11-23 NOTE — Patient Instructions (Signed)
purchase a [facility] left [facility] Wear the heel protector at night or when sitting with your leg straight

## 2013-11-28 ENCOUNTER — Telehealth: Payer: Self-pay | Admitting: Neurology

## 2013-11-28 NOTE — Telephone Encounter (Signed)
Left message for patient regarding rescheduling 01/18/14 appointment per Carolyn's scheduled, rescheduled to Dr. Zannie CoveYan's first available that same day.

## 2013-11-29 ENCOUNTER — Encounter: Payer: Self-pay | Admitting: Neurology

## 2013-12-20 ENCOUNTER — Telehealth (HOSPITAL_COMMUNITY): Payer: Self-pay | Admitting: *Deleted

## 2013-12-21 ENCOUNTER — Encounter (HOSPITAL_COMMUNITY): Payer: Self-pay | Admitting: *Deleted

## 2013-12-21 ENCOUNTER — Other Ambulatory Visit (HOSPITAL_COMMUNITY): Payer: Self-pay | Admitting: Cardiovascular Disease

## 2013-12-21 DIAGNOSIS — R0989 Other specified symptoms and signs involving the circulatory and respiratory systems: Secondary | ICD-10-CM

## 2014-01-11 ENCOUNTER — Ambulatory Visit (HOSPITAL_COMMUNITY)
Admission: RE | Admit: 2014-01-11 | Discharge: 2014-01-11 | Disposition: A | Discharge status: Home / Self Care | Payer: Medicare Other | Source: Ambulatory Visit | Attending: Internal Medicine | Admitting: Internal Medicine

## 2014-01-11 DIAGNOSIS — R0989 Other specified symptoms and signs involving the circulatory and respiratory systems: Secondary | ICD-10-CM | POA: Diagnosis present

## 2014-01-11 DIAGNOSIS — I779 Disorder of arteries and arterioles, unspecified: Secondary | ICD-10-CM | POA: Diagnosis not present

## 2014-01-11 NOTE — Progress Notes (Signed)
Lower Extremity Arterial Duplex Completed. °Brianna L Mazza,RVT °

## 2014-01-16 ENCOUNTER — Telehealth: Payer: Self-pay | Admitting: Cardiovascular Disease

## 2014-01-16 NOTE — Telephone Encounter (Signed)
Closed encounter °

## 2014-01-18 ENCOUNTER — Ambulatory Visit: Payer: Medicare Other | Admitting: Nurse Practitioner

## 2014-01-18 ENCOUNTER — Ambulatory Visit: Payer: Medicare Other | Admitting: Neurology

## 2014-01-19 ENCOUNTER — Other Ambulatory Visit: Payer: Self-pay | Admitting: Internal Medicine

## 2014-01-19 DIAGNOSIS — Z1231 Encounter for screening mammogram for malignant neoplasm of breast: Secondary | ICD-10-CM

## 2014-01-24 ENCOUNTER — Encounter: Payer: Self-pay | Admitting: *Deleted

## 2014-02-03 ENCOUNTER — Encounter: Payer: Self-pay | Admitting: Cardiovascular Disease

## 2014-02-03 ENCOUNTER — Ambulatory Visit (INDEPENDENT_AMBULATORY_CARE_PROVIDER_SITE_OTHER): Payer: Medicare Other | Admitting: Cardiovascular Disease

## 2014-02-03 VITALS — BP 116/54 | HR 80 | Ht 61.0 in | Wt 135.0 lb

## 2014-02-03 DIAGNOSIS — I10 Essential (primary) hypertension: Secondary | ICD-10-CM

## 2014-02-03 DIAGNOSIS — E785 Hyperlipidemia, unspecified: Secondary | ICD-10-CM

## 2014-02-03 DIAGNOSIS — I2510 Atherosclerotic heart disease of native coronary artery without angina pectoris: Secondary | ICD-10-CM

## 2014-02-03 DIAGNOSIS — D689 Coagulation defect, unspecified: Secondary | ICD-10-CM

## 2014-02-03 DIAGNOSIS — I2584 Coronary atherosclerosis due to calcified coronary lesion: Secondary | ICD-10-CM

## 2014-02-03 DIAGNOSIS — Z79899 Other long term (current) drug therapy: Secondary | ICD-10-CM

## 2014-02-03 DIAGNOSIS — Z87891 Personal history of nicotine dependence: Secondary | ICD-10-CM

## 2014-02-03 DIAGNOSIS — I739 Peripheral vascular disease, unspecified: Secondary | ICD-10-CM

## 2014-02-03 DIAGNOSIS — I701 Atherosclerosis of renal artery: Secondary | ICD-10-CM

## 2014-02-03 NOTE — Patient Instructions (Signed)
Dr. Berry has ordered a peripheral angiogram to be done at [facility].  This procedure is going to look at the bloodflow in your lower extremities.  If Dr. Berry is able to open up the arteries, you will have to spend one night in the hospital.  If he is not able to open the arteries, you will be able to go home that same day.    After the procedure, you will not be allowed to drive for 3 days or push, pull, or lift anything greater than 10 lbs for one week.    You will be required to have the following tests prior to the procedure:  1. Blood work-the blood work can be done no more than 7 days prior to the procedure.  It can be done at any [facility] lab.  There is one downstairs on the first floor of this building and one in the [facility] ([address])  2. Chest Xray-the chest xray order has already been placed at the [facility].     *REPS n/a    

## 2014-02-03 NOTE — Assessment & Plan Note (Signed)
History of peripheral arterial disease with known occluded SFAs bilaterally. Recent lower extremity arterial Doppler studies performed 1/55/16 revealed ABIs in the 0.4-0.5 range with occluded SFAs which is normal finding but otherwise high-grade common femoral and external iliac artery stenoses. Her claudication has become more lifestyle limiting. Given the fact that she is now on hemodialysis contrast is no longer an issue. We talked about performing angiography and potential percutaneous intervention for relief of lifestyle limiting claudication which she has agreed to pursue.

## 2014-02-03 NOTE — Progress Notes (Signed)
02/03/2014 Sierra Riley   01/05/1941  [id number]  Primary Physician Gaspar GarbeISOVEC,RICHARD W, MD Primary Cardiologist: Runell GessJonathan J. Yoel Kaufhold MD Roseanne RenoFACP,FACC,FAHA, FSCAI   HPI:  [name] is a 74 year old appearing married African American female mother of 4, grandmother of 9 grandchildren he was formally a patient Dr. Kandis CockingWeintraub's.  Her primary care physician is Dr. Wylene Simmerisovec . Her history is remarkable for CAD status post intervention by Dr. Alanda AmassWeintraub 07/05/99 on all 3 coronary arteries with stenting of her LAD and circumflex. She has normal LV function and a low risk Myoview 10/08/10.. Cardiac risk factors include remote tobacco abuse having quit in 2001, 2 hypertension, diabetes and hyperlipidemia. She does have chronic renal insufficiency on hemodialysis since last December. She also has known occluded SFA by  ultrasound . Recent Dopplers performed 01/6614 revealed progression of disease in her common femoral and external iliac arteries. She does complain of lifestyle limiting claudication. She denies chest pain or shortness of breath.   Current Outpatient Prescriptions  Medication Sig Dispense Refill  . amLODipine (NORVASC) 10 MG tablet Take 10 mg by mouth daily.   0  . aspirin 81 MG tablet Take 81 mg by mouth daily.      . carbidopa-levodopa (SINEMET IR) 25-100 MG per tablet TAKE ONE TABLET BY MOUTH 4 TIMES DAILY 120 tablet 0  . carbidopa-levodopa (SINEMET IR) 25-100 MG per tablet 1.5 tabs 7am, 1.5 12n, 1 tab at 4pm 1 tab at 8pm   Take three times in the day of HD, Tue/Thur/Sat. 150 tablet 12  . cloNIDine (CATAPRES) 0.3 MG tablet Take 1 tablet by mouth 2 (two) times daily.     Marland Kitchen. ezetimibe-simvastatin (VYTORIN) 10-80 MG per tablet Take 1 tablet by mouth at bedtime.      . insulin aspart protamine-insulin aspart (NOVOLOG 70/30) (70-30) 100 UNIT/ML injection Inject 12 Units into the skin 2 (two) times daily.     . metoprolol (LOPRESSOR) 100 MG tablet Take 100 mg by mouth 2 (two) times daily.     Marland Kitchen.  NOVOFINE 32G X 6 MM MISC      No current facility-administered medications for this visit.    No Known Allergies  History   Social History  . Marital Status: Married    Spouse Name: Sierra Riley    Number of Children: 4  . Years of Education: 12   Occupational History  .      retired   Social History Main Topics  . Smoking status: Former Smoker -- 0.30 packs/day for 10 years    Types: Cigarettes    Quit date: 07/18/1999  . Smokeless tobacco: Never Used  . Alcohol Use: No  . Drug Use: No  . Sexual Activity: Not on file   Other Topics Concern  . Not on file   Social History Narrative   Patient lives at home with her husband Sierra Fearing(James). Patient is retired, [employer] education.   Patient 1/2 cup of caffeine daily.   Right handed.     Review of Systems: General: negative for chills, fever, night sweats or weight changes.  Cardiovascular: negative for chest pain, dyspnea on exertion, edema, orthopnea, palpitations, paroxysmal nocturnal dyspnea or shortness of breath Dermatological: negative for rash Respiratory: negative for cough or wheezing Urologic: negative for hematuria Abdominal: negative for nausea, vomiting, diarrhea, bright red blood per rectum, melena, or hematemesis Neurologic: negative for visual changes, syncope, or dizziness All other systems reviewed and are otherwise negative except as noted above.    Blood pressure  116/54, pulse 80, height 5\' 1"  (1.549 m), weight 135 lb (61.236 kg).  General appearance: alert and no distress Neck: no adenopathy, no JVD, supple, symmetrical, trachea midline, thyroid not enlarged, symmetric, no tenderness/mass/nodules and bilateral carotid bruits with to and fro bruit on the left probably related to her AV fistula Lungs: clear to auscultation bilaterally Heart: regular rate and rhythm, S1, S2 normal, no murmur, click, rub or gallop Extremities: extremities normal, atraumatic, no cyanosis or edema and 1+ femorals with bruits  bilaterally  EKG normal sinus rhythm at 69 without ST or T-wave changes. I personally reviewed this EKG  ASSESSMENT AND PLAN:   Renal artery stenosis History of right renal artery stenosis documented by cardiac catheterization in 2001. I angiogram to her because of resistant hypertension 04/02/10 with CO2 revealing this to be patent with only a 60% proximal stenosis. Since that time, she has gone on hemodialysis and her blood pressures have been more easily controlled.   Peripheral arterial disease History of peripheral arterial disease with known occluded SFAs bilaterally. Recent lower extremity arterial Doppler studies performed 1/55/16 revealed ABIs in the 0.4-0.5 range with occluded SFAs which is normal finding but otherwise high-grade common femoral and external iliac artery stenoses. Her claudication has become more lifestyle limiting. Given the fact that she is now on hemodialysis contrast is no longer an issue. We talked about performing angiography and potential percutaneous intervention for relief of lifestyle limiting claudication which she has agreed to pursue.   Hyperlipidemia History of hyperlipidemia on Vytorin 10/80 followed by her PCP   Essential hypertension History of hypertension with blood pressure measured at 116/54. She is on amlodipine, clonidine, and metoprolol. Continue current meds at current dosing   Coronary artery disease History of CAD status post intervention on all 3 coronary arteries by Dr. Alanda AmassWeintraub 07/05/99. Her last Myoview performed 10/08/10 showed mild anterolateral ischemia. She denies chest pain or shortness of breath.       Runell GessJonathan J. Rock Sobol MD FACP,FACC,FAHA, [facility] 02/03/2014 3:31 PM

## 2014-02-03 NOTE — Assessment & Plan Note (Signed)
History of hyperlipidemia on Vytorin 10/80 followed by her PCP

## 2014-02-03 NOTE — Assessment & Plan Note (Signed)
History of CAD status post intervention on all 3 coronary arteries by Dr. Alanda AmassWeintraub 07/05/99. Her last Myoview performed 10/08/10 showed mild anterolateral ischemia. She denies chest pain or shortness of breath.

## 2014-02-03 NOTE — Assessment & Plan Note (Signed)
History of hypertension with blood pressure measured at 116/54. She is on amlodipine, clonidine, and metoprolol. Continue current meds at current dosing

## 2014-02-03 NOTE — Assessment & Plan Note (Signed)
History of right renal artery stenosis documented by cardiac catheterization in 2001. I angiogram to her because of resistant hypertension 04/02/10 with CO2 revealing this to be patent with only a 60% proximal stenosis. Since that time, she has gone on hemodialysis and her blood pressures have been more easily controlled.

## 2014-02-07 ENCOUNTER — Telehealth: Payer: Self-pay | Admitting: Cardiovascular Disease

## 2014-02-07 ENCOUNTER — Ambulatory Visit
Admission: RE | Admit: 2014-02-07 | Discharge: 2014-02-07 | Disposition: A | Discharge status: Home / Self Care | Payer: Medicare Other | Source: Ambulatory Visit | Attending: Cardiovascular Disease | Admitting: Cardiovascular Disease

## 2014-02-07 DIAGNOSIS — Z87891 Personal history of nicotine dependence: Secondary | ICD-10-CM

## 2014-02-07 LAB — CBC
HEMATOCRIT: 38 % (ref 36.0–46.0)
Hemoglobin: 12.5 g/dL (ref 12.0–15.0)
MCH: 29.4 pg (ref 26.0–34.0)
MCHC: 32.9 g/dL (ref 30.0–36.0)
MCV: 89.4 fL (ref 78.0–100.0)
MPV: 9.6 fL (ref 8.6–12.4)
PLATELETS: 208 10*3/uL (ref 150–400)
RBC: 4.25 MIL/uL (ref 3.87–5.11)
RDW: 14.8 % (ref 11.5–15.5)
WBC: 7.2 10*3/uL (ref 4.0–10.5)

## 2014-02-07 LAB — BASIC METABOLIC PANEL
BUN: 56 mg/dL — ABNORMAL HIGH (ref 6–23)
CO2: 28 meq/L (ref 19–32)
Calcium: 9.2 mg/dL (ref 8.4–10.5)
Chloride: 99 mEq/L (ref 96–112)
Creat: 7.76 mg/dL — ABNORMAL HIGH (ref 0.50–1.10)
Glucose, Bld: 242 mg/dL — ABNORMAL HIGH (ref 70–99)
Potassium: 5.5 mEq/L — ABNORMAL HIGH (ref 3.5–5.3)
SODIUM: 142 meq/L (ref 135–145)

## 2014-02-07 LAB — APTT: APTT: 38 s — AB (ref 24–37)

## 2014-02-07 LAB — PROTIME-INR
INR: 1.07 (ref <1.50)
Prothrombin Time: 13.9 seconds (ref 11.6–15.2)

## 2014-02-07 LAB — TSH: TSH: 0.684 u[IU]/mL (ref 0.350–4.500)

## 2014-02-07 NOTE — Telephone Encounter (Signed)
MD aware of result. MD states in result note "Basic metabolic panel consistent with chronic renal insufficiency on hemodialysis.".

## 2014-02-07 NOTE — Telephone Encounter (Signed)
[facility] called to report alert high value for Creatinine, 7.76. Result read back and verified.

## 2014-02-08 ENCOUNTER — Encounter: Payer: Self-pay | Admitting: *Deleted

## 2014-02-13 ENCOUNTER — Other Ambulatory Visit: Payer: Self-pay | Admitting: Cardiology

## 2014-02-13 ENCOUNTER — Encounter (HOSPITAL_COMMUNITY)
Admission: RE | Disposition: A | Discharge status: Home / Self Care | Payer: Self-pay | Source: Ambulatory Visit | Attending: Cardiovascular Disease

## 2014-02-13 ENCOUNTER — Encounter (HOSPITAL_COMMUNITY): Payer: Self-pay | Admitting: General Practice

## 2014-02-13 ENCOUNTER — Ambulatory Visit (HOSPITAL_COMMUNITY)
Admission: RE | Admit: 2014-02-13 | Discharge: 2014-02-14 | Disposition: A | Discharge status: Home / Self Care | Payer: Medicare Other | Source: Ambulatory Visit | Attending: Cardiovascular Disease | Admitting: Cardiovascular Disease

## 2014-02-13 DIAGNOSIS — I739 Peripheral vascular disease, unspecified: Secondary | ICD-10-CM

## 2014-02-13 DIAGNOSIS — I701 Atherosclerosis of renal artery: Secondary | ICD-10-CM | POA: Diagnosis not present

## 2014-02-13 DIAGNOSIS — N186 End stage renal disease: Secondary | ICD-10-CM | POA: Diagnosis not present

## 2014-02-13 DIAGNOSIS — Z9861 Coronary angioplasty status: Secondary | ICD-10-CM

## 2014-02-13 DIAGNOSIS — I2510 Atherosclerotic heart disease of native coronary artery without angina pectoris: Secondary | ICD-10-CM | POA: Diagnosis not present

## 2014-02-13 DIAGNOSIS — G20A1 Parkinson's disease without dyskinesia, without mention of fluctuations: Secondary | ICD-10-CM | POA: Diagnosis present

## 2014-02-13 DIAGNOSIS — Z79899 Other long term (current) drug therapy: Secondary | ICD-10-CM | POA: Diagnosis not present

## 2014-02-13 DIAGNOSIS — E785 Hyperlipidemia, unspecified: Secondary | ICD-10-CM | POA: Diagnosis not present

## 2014-02-13 DIAGNOSIS — I70213 Atherosclerosis of native arteries of extremities with intermittent claudication, bilateral legs: Secondary | ICD-10-CM | POA: Insufficient documentation

## 2014-02-13 DIAGNOSIS — G20 Parkinson's disease: Secondary | ICD-10-CM | POA: Diagnosis not present

## 2014-02-13 DIAGNOSIS — E119 Type 2 diabetes mellitus without complications: Secondary | ICD-10-CM

## 2014-02-13 DIAGNOSIS — I2584 Coronary atherosclerosis due to calcified coronary lesion: Secondary | ICD-10-CM

## 2014-02-13 DIAGNOSIS — Z992 Dependence on renal dialysis: Secondary | ICD-10-CM | POA: Insufficient documentation

## 2014-02-13 DIAGNOSIS — Z794 Long term (current) use of insulin: Secondary | ICD-10-CM | POA: Insufficient documentation

## 2014-02-13 DIAGNOSIS — Z7982 Long term (current) use of aspirin: Secondary | ICD-10-CM | POA: Insufficient documentation

## 2014-02-13 DIAGNOSIS — Z87891 Personal history of nicotine dependence: Secondary | ICD-10-CM

## 2014-02-13 DIAGNOSIS — I120 Hypertensive chronic kidney disease with stage 5 chronic kidney disease or end stage renal disease: Secondary | ICD-10-CM | POA: Insufficient documentation

## 2014-02-13 DIAGNOSIS — D689 Coagulation defect, unspecified: Secondary | ICD-10-CM

## 2014-02-13 DIAGNOSIS — Z959 Presence of cardiac and vascular implant and graft, unspecified: Secondary | ICD-10-CM

## 2014-02-13 DIAGNOSIS — I10 Essential (primary) hypertension: Secondary | ICD-10-CM | POA: Diagnosis present

## 2014-02-13 HISTORY — PX: ILIAC ARTERY STENT: SHX1786

## 2014-02-13 HISTORY — DX: Presence of cardiac and vascular implant and graft, unspecified: Z95.9

## 2014-02-13 HISTORY — DX: Family history of other specified conditions: Z84.89

## 2014-02-13 HISTORY — PX: LOWER EXTREMITY ANGIOGRAM: SHX5508

## 2014-02-13 LAB — POCT ACTIVATED CLOTTING TIME
Activated Clotting Time: 190 seconds
Activated Clotting Time: 171 seconds

## 2014-02-13 SURGERY — ANGIOGRAM, LOWER EXTREMITY
Anesthesia: LOCAL

## 2014-02-13 MED ORDER — EZETIMIBE-SIMVASTATIN 10-80 MG PO TABS
1.0000 | ORAL_TABLET | Freq: Every day | ORAL | Status: DC
  Administered 2014-02-13: 1 via ORAL
  Filled 2014-02-13 (×2): qty 1

## 2014-02-13 MED ORDER — CARBIDOPA-LEVODOPA 25-100 MG PO TABS
1.0000 | ORAL_TABLET | Freq: Four times a day (QID) | ORAL | Status: DC
Start: 2014-02-13 — End: 2014-02-14
  Filled 2014-02-13 (×7): qty 1

## 2014-02-13 MED ORDER — CARBIDOPA-LEVODOPA 25-100 MG PO TABS
1.0000 | ORAL_TABLET | Freq: Four times a day (QID) | ORAL | Status: DC
  Administered 2014-02-13 (×3): 1 via ORAL
  Filled 2014-02-13 (×7): qty 1

## 2014-02-13 MED ORDER — HEPARIN (PORCINE) IN NACL 2-0.9 UNIT/ML-% IJ SOLN
INTRAMUSCULAR | Status: AC
  Filled 2014-02-13: qty 1000

## 2014-02-13 MED ORDER — CLOPIDOGREL BISULFATE 75 MG PO TABS
300.0000 mg | ORAL_TABLET | Freq: Once | ORAL | Status: AC
  Administered 2014-02-13: 300 mg via ORAL

## 2014-02-13 MED ORDER — INSULIN ASPART PROT & ASPART (70-30 MIX) 100 UNIT/ML ~~LOC~~ SUSP
12.0000 [IU] | Freq: Two times a day (BID) | SUBCUTANEOUS | Status: DC
  Administered 2014-02-13: 12 [IU] via SUBCUTANEOUS
  Filled 2014-02-13 (×2): qty 10

## 2014-02-13 MED ORDER — CLONIDINE HCL 0.3 MG PO TABS
0.3000 mg | ORAL_TABLET | Freq: Two times a day (BID) | ORAL | Status: DC
  Administered 2014-02-13 (×2): 0.3 mg via ORAL
  Filled 2014-02-13 (×4): qty 1

## 2014-02-13 MED ORDER — METOPROLOL TARTRATE 100 MG PO TABS
100.0000 mg | ORAL_TABLET | Freq: Two times a day (BID) | ORAL | Status: DC
  Administered 2014-02-13 (×2): 100 mg via ORAL
  Filled 2014-02-13 (×3): qty 1
  Filled 2014-02-13: qty 4
  Filled 2014-02-13: qty 1

## 2014-02-13 MED ORDER — SODIUM CHLORIDE 0.9 % IV SOLN: INTRAVENOUS | Status: AC

## 2014-02-13 MED ORDER — ONDANSETRON HCL 4 MG/2ML IJ SOLN: 4.0000 mg | Freq: Four times a day (QID) | INTRAMUSCULAR | Status: DC | PRN

## 2014-02-13 MED ORDER — CLOPIDOGREL BISULFATE 75 MG PO TABS
75.0000 mg | ORAL_TABLET | Freq: Every day | ORAL | Status: DC
  Administered 2014-02-14: 10:00:00 75 mg via ORAL
  Filled 2014-02-13: qty 1

## 2014-02-13 MED ORDER — ASPIRIN 81 MG PO CHEW: 81.0000 mg | CHEWABLE_TABLET | ORAL | Status: DC

## 2014-02-13 MED ORDER — HEPARIN SODIUM (PORCINE) 1000 UNIT/ML IJ SOLN
INTRAMUSCULAR | Status: AC
  Filled 2014-02-13: qty 1

## 2014-02-13 MED ORDER — SODIUM CHLORIDE 0.9 % IV SOLN: INTRAVENOUS | Status: DC

## 2014-02-13 MED ORDER — FENTANYL CITRATE 0.05 MG/ML IJ SOLN
INTRAMUSCULAR | Status: AC
  Filled 2014-02-13: qty 2

## 2014-02-13 MED ORDER — LIDOCAINE HCL (PF) 1 % IJ SOLN
INTRAMUSCULAR | Status: AC
  Filled 2014-02-13: qty 30

## 2014-02-13 MED ORDER — SODIUM CHLORIDE 0.9 % IJ SOLN: 3.0000 mL | INTRAMUSCULAR | Status: DC | PRN

## 2014-02-13 MED ORDER — ASPIRIN 81 MG PO CHEW: 81.0000 mg | CHEWABLE_TABLET | Freq: Every day | ORAL | Status: DC

## 2014-02-13 MED ORDER — MIDAZOLAM HCL 2 MG/2ML IJ SOLN
INTRAMUSCULAR | Status: AC
  Filled 2014-02-13: qty 2

## 2014-02-13 MED ORDER — CLOPIDOGREL BISULFATE 300 MG PO TABS
ORAL_TABLET | ORAL | Status: AC
  Filled 2014-02-13: qty 1

## 2014-02-13 MED ORDER — ACETAMINOPHEN 325 MG PO TABS: 650.0000 mg | ORAL_TABLET | ORAL | Status: DC | PRN

## 2014-02-13 MED ORDER — ASPIRIN EC 325 MG PO TBEC
325.0000 mg | DELAYED_RELEASE_TABLET | Freq: Every day | ORAL | Status: DC
  Administered 2014-02-14: 325 mg via ORAL
  Filled 2014-02-13 (×2): qty 1

## 2014-02-13 MED ORDER — MORPHINE SULFATE 2 MG/ML IJ SOLN
1.0000 mg | INTRAMUSCULAR | Status: DC | PRN
  Administered 2014-02-13: 1 mg via INTRAVENOUS
  Filled 2014-02-13: qty 1

## 2014-02-13 MED ORDER — AMLODIPINE BESYLATE 10 MG PO TABS
10.0000 mg | ORAL_TABLET | Freq: Every day | ORAL | Status: DC
  Filled 2014-02-13 (×2): qty 1

## 2014-02-13 NOTE — Progress Notes (Signed)
HYPOGLYCEMIC EVENT  CBG: 60  Treatment: 15 GM carbohydrate snack  Symptoms: None  Follow-up CBG: Time:1025 CBG Result:98  Possible Reasons for Event: Inadequate meal intake  Comments/MD notified kept pt monitored, stable,

## 2014-02-13 NOTE — Progress Notes (Signed)
Site area: LFA Site Prior to Removal:  Level 0 Pressure Applied For:4445min Manual:  yes  Patient Status During Pull:  stable Post Pull Site:  Level 0 Post Pull Instructions Given:  yes Post Pull Pulses Present: doppler Dressing Applied: clear  Bedrest begins @ 1140 Comments:

## 2014-02-13 NOTE — Op Note (Signed)
Sierra Riley is a 74 y.o. female    [id number] LOCATION:  FACILITY: [facility]  PHYSICIAN: Nanetta Batty, M.D. 09/12/1940   DATE OF PROCEDURE:  02/13/2014  DATE OF DISCHARGE:     PV Angiogram/Intervention    History obtained from chart review.Sierra Riley is a 73 year old appearing married African American female mother of 4, grandmother of 9 grandchildren he was formally a patient Dr. Kandis Cocking. Her primary care physician is Dr. Wylene Simmer . Her history is remarkable for CAD status post intervention by Dr. Alanda Amass 07/05/99 on all 3 coronary arteries with stenting of her LAD and circumflex. She has normal LV function and a low risk Myoview 10/08/10.. Cardiac risk factors include remote tobacco abuse having quit in 2001, 2 hypertension, diabetes and hyperlipidemia. She does have chronic renal insufficiency on hemodialysis since last December. She also has known occluded SFA by ultrasound . Recent Dopplers performed 01/11/14 revealed progression of disease in her common femoral and external iliac arteries. She does complain of lifestyle limiting claudication. She presents today for angiography and potential percutaneous intervention.  PROCEDURE DESCRIPTION:   The patient was brought to the second floor [facility] cath lab in the postabsorptive state. She was premedicated with Valium 5 mg by mouth, IV Versed and fentanyl. Her left groinwas prepped and shaved in usual sterile fashion. Xylocaine 1% was used for local anesthesia. A 5 French sheath was inserted into the left common femoral artery using standard Seldinger technique. A 5 French pigtail catheters placed in the distal abdominal aorta. Distal abdominal aortography, bilateral iliac angiography with bifemoral runoff was performed using bullous changes digital subtraction step table technique. I then obtained contralateral access with a crossover catheter and ental catheter which I placed across the right external iliac artery stenosis and  performed a pullback gradient demonstrating 25 mmHg transstenotic gradient. Visipaque dye was used for the entirety of the case. Retrograde aortic pressure was monitored during the case.   HEMODYNAMICS:    AO SYSTOLIC/AO DIASTOLIC: 144/64   Angiographic Data:   1: Abdominal aorta-the distal dominant aorta was small in caliber and had moderate fluoroscopic calcification.  2: Left lower extremity-40-50% ostial left common iliac artery stenosis, 70% segmental left external iliac artery stenosis with a 40 mm Hg  pullback gradient. The left as it was occluded as origin and reconstituted in the adductor canal by profunda femoris collaterals. There is two-vessel runoff with an occluded posterior tibial artery.  3: Right lower extremity-50 and 60% right external iliac artery stenosis with a 25 mm pullback gradient. Occluded right SFA at its origin with reconstitution in the adductor canal by profunda femoris collaterals. There was two-vessel runoff with an occluded posterior tibial artery.  IMPRESSION:Sierra Riley has hemodynamically significant bilateral external iliac artery stenosis representing inflow disease with known occluded SFAs and infrapopliteal disease. He will proceed with percutaneous revascularization of her external iliac arteries for lifestyle limiting claudication.  Procedure Description:the patient received a total of 6500 units of heparin with an ACT of 227. Total contrast administered to the patient was 210 mL. I placed a contralateral sheath across the iliac bifurcation from the left groin into the right common iliac artery. Over a 0.35 versus core wire I predilated the right external iliac artery with a 5 mm x 2 cm balloon and placed a 7 mm x 40 mm long Cordis Smart nitinol self-expanding stent across the diseased segment. I then postdilated with a 60 m by 4 cm balloon reducing a 50-60% stenosis to 0% residual. I demonstrated  crossover sheath back across the bifurcation and across the  diseased segment of the left external iliac artery. I stented this with a 8 mm x 6 cm Atrovent nitinol absolute Pro self-expanding stent and postdilated with the same 6 mm x 4 cm balloon resulting in reduction of a 70% segmental stenosis to 0% residual. The patient tolerated the procedure well. The destination sheath was then exchanged over the wire for a short 6 French sheath which was then secured. The patient left the lab in stable condition.  Final Impression: successful bilateral external iliac artery stenting for lifestyle limiting claudication with nitinol self expanding stents. The patient will receive 300 mg of Plavix by mouth followed by 75 mg a day in addition to aspirin. The sheath will be removed once the ACT falls below 170 pressure will be held. She'll be hydrated overnight and remember, for 6 hours. We will arrange for her to have hemodialysis tomorrow morning after which she'll be discharged home. We will get follow-up lower extremity arterial Doppler studies in our [facility] office next week and we'll see her back 2-3 weeks later with a mid-level provider on a day that I in the office.    Runell GessBERRY,Sierra Klus J. MD, [facility] CenterFACC 02/13/2014 8:58 AM

## 2014-02-13 NOTE — Interval H&P Note (Signed)
History and Physical Interval Note:  02/13/2014 7:41 AM  Sierra ButteryJoyce P Riley  has presented today for surgery, with the diagnosis of pvd  The various methods of treatment have been discussed with the patient and family. After consideration of risks, benefits and other options for treatment, the patient has consented to  Procedure(s): LOWER EXTREMITY ANGIOGRAM (N/A) as a surgical intervention .  The patient's history has been reviewed, patient examined, no change in status, stable for surgery.  I have reviewed the patient's chart and labs.  Questions were answered to the patient's satisfaction.     Runell GessBERRY,Christasia Angeletti J

## 2014-02-13 NOTE — H&P (View-Only) (Signed)
02/03/2014 Sierra Riley   01/05/1941  [id number]  Primary Physician Gaspar GarbeISOVEC,RICHARD W, MD Primary Cardiologist: Runell GessJonathan J. Berry MD Roseanne RenoFACP,FACC,FAHA, FSCAI   HPI:  [name] is a 74 year old appearing married African American female mother of 4, grandmother of 9 grandchildren he was formally a patient Dr. Kandis CockingWeintraub's.  Her primary care physician is Dr. Wylene Simmerisovec . Her history is remarkable for CAD status post intervention by Dr. Alanda AmassWeintraub 07/05/99 on all 3 coronary arteries with stenting of her LAD and circumflex. She has normal LV function and a low risk Myoview 10/08/10.. Cardiac risk factors include remote tobacco abuse having quit in 2001, 2 hypertension, diabetes and hyperlipidemia. She does have chronic renal insufficiency on hemodialysis since last December. She also has known occluded SFA by  ultrasound . Recent Dopplers performed 01/6614 revealed progression of disease in her common femoral and external iliac arteries. She does complain of lifestyle limiting claudication. She denies chest pain or shortness of breath.   Current Outpatient Prescriptions  Medication Sig Dispense Refill  . amLODipine (NORVASC) 10 MG tablet Take 10 mg by mouth daily.   0  . aspirin 81 MG tablet Take 81 mg by mouth daily.      . carbidopa-levodopa (SINEMET IR) 25-100 MG per tablet TAKE ONE TABLET BY MOUTH 4 TIMES DAILY 120 tablet 0  . carbidopa-levodopa (SINEMET IR) 25-100 MG per tablet 1.5 tabs 7am, 1.5 12n, 1 tab at 4pm 1 tab at 8pm   Take three times in the day of HD, Tue/Thur/Sat. 150 tablet 12  . cloNIDine (CATAPRES) 0.3 MG tablet Take 1 tablet by mouth 2 (two) times daily.     Marland Kitchen. ezetimibe-simvastatin (VYTORIN) 10-80 MG per tablet Take 1 tablet by mouth at bedtime.      . insulin aspart protamine-insulin aspart (NOVOLOG 70/30) (70-30) 100 UNIT/ML injection Inject 12 Units into the skin 2 (two) times daily.     . metoprolol (LOPRESSOR) 100 MG tablet Take 100 mg by mouth 2 (two) times daily.     Marland Kitchen.  NOVOFINE 32G X 6 MM MISC      No current facility-administered medications for this visit.    No Known Allergies  History   Social History  . Marital Status: Married    Spouse Name: Fayrene FearingJames    Number of Children: 4  . Years of Education: 12   Occupational History  .      retired   Social History Main Topics  . Smoking status: Former Smoker -- 0.30 packs/day for 10 years    Types: Cigarettes    Quit date: 07/18/1999  . Smokeless tobacco: Never Used  . Alcohol Use: No  . Drug Use: No  . Sexual Activity: Not on file   Other Topics Concern  . Not on file   Social History Narrative   Patient lives at home with her husband [name](James). Patient is retired, [employer] education.   Patient 1/2 cup of caffeine daily.   Right handed.     Review of Systems: General: negative for chills, fever, night sweats or weight changes.  Cardiovascular: negative for chest pain, dyspnea on exertion, edema, orthopnea, palpitations, paroxysmal nocturnal dyspnea or shortness of breath Dermatological: negative for rash Respiratory: negative for cough or wheezing Urologic: negative for hematuria Abdominal: negative for nausea, vomiting, diarrhea, bright red blood per rectum, melena, or hematemesis Neurologic: negative for visual changes, syncope, or dizziness All other systems reviewed and are otherwise negative except as noted above.    Blood pressure  116/54, pulse 80, height 5\' 1"  (1.549 m), weight 135 lb (61.236 kg).  General appearance: alert and no distress Neck: no adenopathy, no JVD, supple, symmetrical, trachea midline, thyroid not enlarged, symmetric, no tenderness/mass/nodules and bilateral carotid bruits with to and fro bruit on the left probably related to her AV fistula Lungs: clear to auscultation bilaterally Heart: regular rate and rhythm, S1, S2 normal, no murmur, click, rub or gallop Extremities: extremities normal, atraumatic, no cyanosis or edema and 1+ femorals with bruits  bilaterally  EKG normal sinus rhythm at 69 without ST or T-wave changes. I personally reviewed this EKG  ASSESSMENT AND PLAN:   Renal artery stenosis History of right renal artery stenosis documented by cardiac catheterization in 2001. I angiogram to her because of resistant hypertension 04/02/10 with CO2 revealing this to be patent with only a 60% proximal stenosis. Since that time, she has gone on hemodialysis and her blood pressures have been more easily controlled.   Peripheral arterial disease History of peripheral arterial disease with known occluded SFAs bilaterally. Recent lower extremity arterial Doppler studies performed 1/55/16 revealed ABIs in the 0.4-0.5 range with occluded SFAs which is normal finding but otherwise high-grade common femoral and external iliac artery stenoses. Her claudication has become more lifestyle limiting. Given the fact that she is now on hemodialysis contrast is no longer an issue. We talked about performing angiography and potential percutaneous intervention for relief of lifestyle limiting claudication which she has agreed to pursue.   Hyperlipidemia History of hyperlipidemia on Vytorin 10/80 followed by her PCP   Essential hypertension History of hypertension with blood pressure measured at 116/54. She is on amlodipine, clonidine, and metoprolol. Continue current meds at current dosing   Coronary artery disease History of CAD status post intervention on all 3 coronary arteries by Dr. Alanda AmassWeintraub 07/05/99. Her last Myoview performed 10/08/10 showed mild anterolateral ischemia. She denies chest pain or shortness of breath.       Runell GessJonathan J. Berry MD FACP,FACC,FAHA, J[facility] HospitalFSCAI 02/03/2014 3:31 PM

## 2014-02-14 ENCOUNTER — Encounter (HOSPITAL_COMMUNITY): Payer: Self-pay | Admitting: Cardiology

## 2014-02-14 DIAGNOSIS — Z959 Presence of cardiac and vascular implant and graft, unspecified: Secondary | ICD-10-CM

## 2014-02-14 DIAGNOSIS — I70213 Atherosclerosis of native arteries of extremities with intermittent claudication, bilateral legs: Secondary | ICD-10-CM | POA: Diagnosis not present

## 2014-02-14 DIAGNOSIS — I739 Peripheral vascular disease, unspecified: Secondary | ICD-10-CM

## 2014-02-14 HISTORY — DX: Presence of cardiac and vascular implant and graft, unspecified: Z95.9

## 2014-02-14 LAB — POCT ACTIVATED CLOTTING TIME
ACTIVATED CLOTTING TIME: 214 s
Activated Clotting Time: 227 seconds

## 2014-02-14 LAB — BASIC METABOLIC PANEL
Anion gap: 13 (ref 5–15)
BUN: 67 mg/dL — ABNORMAL HIGH (ref 6–23)
CO2: 24 mmol/L (ref 19–32)
CREATININE: 7.82 mg/dL — AB (ref 0.50–1.10)
Calcium: 7.9 mg/dL — ABNORMAL LOW (ref 8.4–10.5)
Chloride: 95 mmol/L — ABNORMAL LOW (ref 96–112)
GFR, EST AFRICAN AMERICAN: 5 mL/min — AB (ref >90)
GFR, EST NON AFRICAN AMERICAN: 5 mL/min — AB (ref >90)
GLUCOSE: 211 mg/dL — AB (ref 70–99)
POTASSIUM: 5.9 mmol/L — AB (ref 3.5–5.1)
Sodium: 132 mmol/L — ABNORMAL LOW (ref 135–145)

## 2014-02-14 LAB — GLUCOSE, CAPILLARY
GLUCOSE-CAPILLARY: 144 mg/dL — AB (ref 70–99)
GLUCOSE-CAPILLARY: 200 mg/dL — AB (ref 70–99)
Glucose-Capillary: 168 mg/dL — ABNORMAL HIGH (ref 70–99)
Glucose-Capillary: 157 mg/dL — ABNORMAL HIGH (ref 70–99)
Glucose-Capillary: 90 mg/dL (ref 70–99)
Glucose-Capillary: 181 mg/dL — ABNORMAL HIGH (ref 70–99)

## 2014-02-14 LAB — CBC
HCT: 35.6 % — ABNORMAL LOW (ref 36.0–46.0)
Hemoglobin: 11.8 g/dL — ABNORMAL LOW (ref 12.0–15.0)
MCH: 29.1 pg (ref 26.0–34.0)
MCHC: 33.1 g/dL (ref 30.0–36.0)
MCV: 87.9 fL (ref 78.0–100.0)
PLATELETS: 190 10*3/uL (ref 150–400)
RBC: 4.05 MIL/uL (ref 3.87–5.11)
RDW: 14.7 % (ref 11.5–15.5)
WBC: 8.4 10*3/uL (ref 4.0–10.5)

## 2014-02-14 LAB — HEPATITIS B SURFACE ANTIGEN: Hepatitis B Surface Ag: NEGATIVE

## 2014-02-14 MED ORDER — DOXERCALCIFEROL 4 MCG/2ML IV SOLN
INTRAVENOUS | Status: AC
  Filled 2014-02-14: qty 2

## 2014-02-14 MED ORDER — PENTAFLUOROPROP-TETRAFLUOROETH EX AERO: 1.0000 "application " | INHALATION_SPRAY | CUTANEOUS | Status: DC | PRN

## 2014-02-14 MED ORDER — DOXERCALCIFEROL 4 MCG/2ML IV SOLN
2.0000 ug | INTRAVENOUS | Status: DC
  Administered 2014-02-14: 2 ug via INTRAVENOUS
  Filled 2014-02-14: qty 2

## 2014-02-14 MED ORDER — LIDOCAINE HCL (PF) 1 % IJ SOLN: 5.0000 mL | INTRAMUSCULAR | Status: DC | PRN

## 2014-02-14 MED ORDER — CARBIDOPA-LEVODOPA 25-100 MG PO TABS
1.0000 | ORAL_TABLET | Freq: Two times a day (BID) | ORAL | Status: DC
  Filled 2014-02-14 (×2): qty 1

## 2014-02-14 MED ORDER — ALTEPLASE 2 MG IJ SOLR: 2.0000 mg | Freq: Once | INTRAMUSCULAR | Status: DC | PRN

## 2014-02-14 MED ORDER — ACETAMINOPHEN 325 MG PO TABS: 650.0000 mg | ORAL_TABLET | ORAL | Status: AC | PRN

## 2014-02-14 MED ORDER — CLOPIDOGREL BISULFATE 75 MG PO TABS: 75.0000 mg | ORAL_TABLET | Freq: Every day | ORAL | Status: DC

## 2014-02-14 MED ORDER — NEPRO/CARBSTEADY PO LIQD: 237.0000 mL | ORAL | Status: DC | PRN

## 2014-02-14 MED ORDER — CARBIDOPA-LEVODOPA 25-100 MG PO TABS
1.5000 | ORAL_TABLET | Freq: Two times a day (BID) | ORAL | Status: DC
  Administered 2014-02-14 (×2): 1.5 via ORAL
  Filled 2014-02-14 (×2): qty 1.5

## 2014-02-14 MED ORDER — SODIUM CHLORIDE 0.9 % IV SOLN: 100.0000 mL | INTRAVENOUS | Status: DC | PRN

## 2014-02-14 MED ORDER — LIDOCAINE-PRILOCAINE 2.5-2.5 % EX CREA: 1.0000 "application " | TOPICAL_CREAM | CUTANEOUS | Status: DC | PRN

## 2014-02-14 MED ORDER — ASPIRIN 325 MG PO TBEC: 325.0000 mg | DELAYED_RELEASE_TABLET | Freq: Every day | ORAL | Status: DC

## 2014-02-14 NOTE — Discharge Summary (Signed)
Physician Discharge Summary       Patient ID: Sierra Riley MRN: [id number] DOB/AGE: [birth year] 74 y.o.  Admit date: 02/13/2014 Discharge date: 02/14/2014 Primary Cardiologist:Dr. Allyson SabalBerry  Discharge Diagnoses:  Principal Problem:   Claudication Active Problems:   Peripheral arterial disease   ESRD (end stage renal disease)   Parkinson's disease   Coronary artery disease   Essential hypertension   Hyperlipidemia   Diabetes   S/P arterial stent, 02/13/14 bilateral external iliacs-nitinol self expanding stents   Discharged Condition: good  Procedures: 02/13/14  PV angiogram and successful bilateral external iliac artery stenting for lifestyle limiting claudication with nitinol self expanding stents  Hospital Course: 74 year old with history of CAD status post intervention by Dr. Alanda AmassWeintraub 07/05/99 on all 3 coronary arteries with stenting of her LAD and circumflex. She has normal LV function and a low risk Myoview 10/08/10.. Cardiac risk factors include remote tobacco abuse having quit in 2001, 2 hypertension, diabetes and hyperlipidemia. She does have chronic renal insufficiency on hemodialysis since last December. She also has known occluded SFA by ultrasound . Recent Dopplers performed 01/11/14 revealed progression of disease in her common femoral and external iliac arteries. She does complain of lifestyle limiting claudication. She presented yesterday for angiography and potential percutaneous intervention- undergoing successful bilateral external iliac artery stenting for lifestyle limiting claudication with nitinol self expanding stents.  She did well overnight and will be dialyzed prior to discharge.  She will ambulate as well.  She was seen and evaluated by Dr. Clifton JamesMcAlhany and found stable for discharge if no complications with dialysis.   Consults: nephrology  Significant Diagnostic Studies:  BMET    Component Value Date/Time   NA 132* 02/14/2014 0443   K 5.9* 02/14/2014 0443   CL  95* 02/14/2014 0443   CO2 24 02/14/2014 0443   GLUCOSE 211* 02/14/2014 0443   BUN 67* 02/14/2014 0443   CREATININE 7.82* 02/14/2014 0443   CREATININE 7.76* 02/07/2014 0934   CALCIUM 7.9* 02/14/2014 0443   GFRNONAA 5* 02/14/2014 0443   GFRAA 5* 02/14/2014 0443    CBC    Component Value Date/Time   WBC 8.4 02/14/2014 0443   RBC 4.05 02/14/2014 0443   HGB 11.8* 02/14/2014 0443   HCT 35.6* 02/14/2014 0443   PLT 190 02/14/2014 0443   MCV 87.9 02/14/2014 0443   MCH 29.1 02/14/2014 0443   MCHC 33.1 02/14/2014 0443   RDW 14.7 02/14/2014 0443       Discharge Exam: Blood pressure 140/89, pulse 57, temperature 98.3 F (36.8 C), temperature source Oral, resp. rate 18, height 5\' 1"  (1.549 m), weight 131 lb 13.4 oz (59.8 kg), SpO2 96 %.    Disposition: 01-Home or Self Care     Medication List    STOP taking these medications        aspirin 81 MG tablet  Replaced by:  aspirin 325 MG EC tablet      TAKE these medications        acetaminophen 325 MG tablet  Commonly known as:  TYLENOL  Take 2 tablets (650 mg total) by mouth every 4 (four) hours as needed for headache or mild pain.     amLODipine 10 MG tablet  Commonly known as:  NORVASC  Take 10 mg by mouth daily.     aspirin 325 MG EC tablet  Take 1 tablet (325 mg total) by mouth daily.     carbidopa-levodopa 25-100 MG per tablet  Commonly known as:  SINEMET IR  TAKE  ONE TABLET BY MOUTH 4 TIMES DAILY     carbidopa-levodopa 25-100 MG per tablet  Commonly known as:  SINEMET IR  - 1.5 tabs 7am, 1.5 12n, 1 tab at 4pm 1 tab at 8pm   -   - Take three times in the day of HD, Tue/Thur/Sat.     cloNIDine 0.3 MG tablet  Commonly known as:  CATAPRES  Take 1 tablet by mouth 2 (two) times daily.     clopidogrel 75 MG tablet  Commonly known as:  PLAVIX  Take 1 tablet (75 mg total) by mouth daily with breakfast.     ezetimibe-simvastatin 10-80 MG per tablet  Commonly known as:  VYTORIN  Take 1 tablet by mouth at  bedtime.     insulin aspart protamine- aspart (70-30) 100 UNIT/ML injection  Commonly known as:  NOVOLOG MIX 70/30  Inject 12 Units into the skin 2 (two) times daily.     metoprolol 100 MG tablet  Commonly known as:  LOPRESSOR  Take 100 mg by mouth 2 (two) times daily.     NOVOFINE 32G X 6 MM Misc  Generic drug:  Insulin Pen Needle       Follow-up Information    Follow up with Runell Gess, MD On 03/10/2014.   Specialty:  Cardiology   Why:  at 2 pm with Wilburt Finlay, PA for Dr. Conception Oms information:   [address] [place] [address] [phone]       Follow up with Runell Gess, MD.   Specialty:  Cardiology   Why:  the office will call with date and time to recheck your blood flow to your legs with doppler study   Contact information:   [address] [place] [address] [phone]        Discharge Instructions: Call [facility] at [phone] if any bleeding, swelling or drainage at cath site.  May shower, no tub baths for 48 hours for groin sticks. No lifting over 5 pounds for 3 days.  No Driving for 3 days.    Heart Healthy Renal, diabetic diet  Continue dialysis T,Th, Sat as before  Follow up as instructed, bring all meds with you to appt.   Plavix is new medication for your stents to your legs.  Take that and asprin  Signed: EXBMWU,XLKGM R Nurse Practitioner-Certified [facility]: [facility] 02/14/2014, 12:04 PM  Time spent on discharge : >30 minutes.

## 2014-02-14 NOTE — Progress Notes (Signed)
On HD with usual Rx - following arteriogram with successful bilateral ext iliac artery stenting by Dr. Allyson SabalBerry due to chronic claudication.  Goal 2L BP 122/60 P 60. Anxious to go home. Planned d/c post HD  Bard HerbertMarty Bulmaro Feagans, PA-C

## 2014-02-14 NOTE — Discharge Instructions (Signed)
[facility] [phone] if any bleeding, swelling or drainage at cath site.  May shower, no tub baths for 48 hours for groin sticks. No lifting over 5 pounds for 3 days.  No Driving for 3 days.    Heart Healthy Renal, diabetic diet  Continue dialysis T,Th, Sat as before  Follow up as instructed, bring all meds with you to appt.   Plavix is new medication for your stents to your legs.  Take that and asprin

## 2014-02-14 NOTE — Progress Notes (Signed)
Returned from HD, denies complaints, eager to go home.  Tele and IV's removed.  Husband assisted pt to get dressed, d/c instructions reviewed w/ pt and husband, he signed paperwork with her permission. Questions answered. Pt did not want meds, states she'll take them at home.  To door per w/c with NT Juanita.

## 2014-02-14 NOTE — Progress Notes (Signed)
Subjective: Complains of sornenss at  Lt groin for dialysis today  Objective: Vital signs in last 24 hours: Temp:  [97.4 F (36.3 C)-98 F (36.7 C)] 98 F (36.7 C) (02/09 0547) Pulse Rate:  [52-81] 61 (02/09 0547) Resp:  [12-20] 20 (02/09 0547) BP: (94-180)/(35-102) 147/44 mmHg (02/09 0547) SpO2:  [91 %-100 %] 99 % (02/09 0547) Weight:  [131 lb 13.4 oz (59.8 kg)] 131 lb 13.4 oz (59.8 kg) (02/09 0020) Weight change: -2.6 oz (-0.075 kg)   Intake/Output from previous day:+584 02/08 0701 - 02/09 0700 In: 684.2 [P.O.:120; I.V.:564.2] Out: 100 [Urine:100] Intake/Output this shift: Total I/O In: 564.2 [I.V.:564.2] Out: 0   PE: General:Pleasant affect, NAD Skin:Warm and dry, brisk capillary refill HEENT:normocephalic, sclera clear, mucus membranes moist Heart:S1S2 RRR with soft systolic murmur, no gallup, rub or click Lungs:clear ant,  without rales, rhonchi, or wheezes ZOX:WRUE, non tender, + BS, do not palpate liver spleen or masses Ext:no lower ext edema, lt groin cath site without hematoma but pt complains of tenderness, 2+ radial pulses Neuro:alert and oriented X 3, MAE, follows commands, + facial symmetry  Tele:  SR to SB   Lab Results:  Recent Labs  02/14/14 0443  WBC 8.4  HGB 11.8*  HCT 35.6*  PLT 190   BMET  Recent Labs  02/14/14 0443  NA 132*  K 5.9*  CL 95*  CO2 24  GLUCOSE 211*  BUN 67*  CREATININE 7.82*  CALCIUM 7.9*     No results found for: CHOL, HDL, LDLCALC, LDLDIRECT, TRIG, CHOLHDL No results found for: AVWU9W   Lab Results  Component Value Date   TSH 0.684 02/07/2014     Studies/Results: No results found.  Medications: I have reviewed the patient's current medications. Scheduled Meds: . amLODipine  10 mg Oral Daily  . aspirin EC  325 mg Oral Daily  . carbidopa-levodopa  1 tablet Oral QID  . carbidopa-levodopa  1 tablet Oral QID  . cloNIDine  0.3 mg Oral BID  . clopidogrel  75 mg Oral Q breakfast  .  ezetimibe-simvastatin  1 tablet Oral QHS  . insulin aspart protamine- aspart  12 Units Subcutaneous BID  . metoprolol  100 mg Oral BID   Continuous Infusions:  PRN Meds:.acetaminophen, morphine injection, ondansetron (ZOFRAN) IV  Assessment/Plan:74 year old with history of CAD status post intervention by Dr. Alanda Amass 07/05/99 on all 3 coronary arteries with stenting of her LAD and circumflex. She has normal LV function and a low risk Myoview 10/08/10.. Cardiac risk factors include remote tobacco abuse having quit in 2001, 2 hypertension, diabetes and hyperlipidemia. She does have chronic renal insufficiency on hemodialysis since last December. She also has known occluded SFA by ultrasound . Recent Dopplers performed 01/11/14 revealed progression of disease in her common femoral and external iliac arteries. She does complain of lifestyle limiting claudication. She presented yesterday for angiography and potential percutaneous intervention- undergoing successful bilateral external iliac artery stenting for lifestyle limiting claudication with nitinol self expanding stents  Principal Problem:   Claudication Active Problems:   Peripheral arterial disease- see above   ESRD (end stage renal disease)for dialysis today- not on list will check   Parkinson's disease-on sinemet   Coronary artery disease-on BB, statin   Essential hypertension-controlled   Hyperlipidemia- followed by PCP-on vytorin    Diabetes-followed by PCP-on insulin   S/P arterial stent, 02/13/14 bilateral external iliacs-nitinol self expanding stents on plavix and ASA- follow up bil dopplers at [facility] and  follow up with APP on day Dr. Erlene QuanJ. Berry in the office.  Once pt up and about if groin site still painful may need to check with ultrasound.    LOS: 1 day   Time spent with pt. :15 minutes. [facility] Hospital And Medical CenterNGOLD,LAURA R  Nurse Practitioner Certified Pager [phone] or after 5pm and on weekends call [phone] 02/14/2014, 6:42 AM   I have personally  seen and examined this patient with Nada BoozerLaura Ingold, NP. I agree with the assessment and plan as outlined above. She is s/p bilateral iliac artery stenting per Dr. Allyson SabalBerry. Left groin with mild tenderness, no bruit, no hematoma. Plan for dialysis here today then d/c home and f/u with Dr. Allyson SabalBerry one week. Continue ASA and Plavix.   Jaeli Grubb 02/14/2014 9:39 AM

## 2014-02-15 ENCOUNTER — Encounter (HOSPITAL_COMMUNITY): Payer: Self-pay | Admitting: *Deleted

## 2014-02-15 ENCOUNTER — Other Ambulatory Visit (HOSPITAL_COMMUNITY): Payer: Self-pay | Admitting: *Deleted

## 2014-02-15 DIAGNOSIS — I739 Peripheral vascular disease, unspecified: Secondary | ICD-10-CM

## 2014-02-15 LAB — GLUCOSE, CAPILLARY
Glucose-Capillary: 60 mg/dL — ABNORMAL LOW (ref 70–99)
Glucose-Capillary: 98 mg/dL (ref 70–99)
Glucose-Capillary: 168 mg/dL — ABNORMAL HIGH (ref 70–99)

## 2014-02-15 LAB — HEPATITIS B SURFACE ANTIBODY,QUALITATIVE: Hep B S Ab: REACTIVE

## 2014-02-22 ENCOUNTER — Ambulatory Visit (INDEPENDENT_AMBULATORY_CARE_PROVIDER_SITE_OTHER): Payer: Medicare Other | Admitting: Podiatry

## 2014-02-22 DIAGNOSIS — B351 Tinea unguium: Secondary | ICD-10-CM

## 2014-02-22 DIAGNOSIS — M79676 Pain in unspecified toe(s): Secondary | ICD-10-CM

## 2014-02-22 NOTE — Patient Instructions (Signed)
Diabetes and Foot Care Diabetes may cause you to have problems because of poor blood supply (circulation) to your feet and legs. This may cause the skin on your feet to become thinner, break easier, and heal more slowly. Your skin may become dry, and the skin may peel and crack. You may also have nerve damage in your legs and feet causing decreased feeling in them. You may not notice minor injuries to your feet that could lead to infections or more serious problems. Taking care of your feet is one of the most important things you can do for yourself.  HOME CARE INSTRUCTIONS  Wear shoes at all times, even in the house. Do not go barefoot. Bare feet are easily injured.  Check your feet daily for blisters, cuts, and redness. If you cannot see the bottom of your feet, use a mirror or ask someone for help.  Wash your feet with warm water (do not use hot water) and mild soap. Then pat your feet and the areas between your toes until they are completely dry. Do not soak your feet as this can dry your skin.  Apply a moisturizing lotion or petroleum jelly (that does not contain alcohol and is unscented) to the skin on your feet and to dry, brittle toenails. Do not apply lotion between your toes.  Trim your toenails straight across. Do not dig under them or around the cuticle. File the edges of your nails with an emery board or nail file.  Do not cut corns or calluses or try to remove them with medicine.  Wear clean socks or stockings every day. Make sure they are not too tight. Do not wear knee-high stockings since they may decrease blood flow to your legs.  Wear shoes that fit properly and have enough cushioning. To break in new shoes, wear them for just a few hours a day. This prevents you from injuring your feet. Always look in your shoes before you put them on to be sure there are no objects inside.  Do not cross your legs. This may decrease the blood flow to your feet.  If you find a minor scrape,  cut, or break in the skin on your feet, keep it and the skin around it clean and dry. These areas may be cleansed with mild soap and water. Do not cleanse the area with peroxide, alcohol, or iodine.  When you remove an adhesive bandage, be sure not to damage the skin around it.  If you have a wound, look at it several times a day to make sure it is healing.  Do not use heating pads or hot water bottles. They may burn your skin. If you have lost feeling in your feet or legs, you may not know it is happening until it is too late.  Make sure your health care provider performs a complete foot exam at least annually or more often if you have foot problems. Report any cuts, sores, or bruises to your health care provider immediately. SEEK MEDICAL CARE IF:   You have an injury that is not healing.  You have cuts or breaks in the skin.  You have an ingrown nail.  You notice redness on your legs or feet.  You feel burning or tingling in your legs or feet.  You have pain or cramps in your legs and feet.  Your legs or feet are numb.  Your feet always feel cold. SEEK IMMEDIATE MEDICAL CARE IF:   There is increasing redness,   swelling, or pain in or around a wound.  There is a red line that goes up your leg.  Pus is coming from a wound.  You develop a fever or as directed by your health care provider.  You notice a bad smell coming from an ulcer or wound. Document Released: 12/21/1999 Document Revised: 08/25/2012 Document Reviewed: 06/01/2012 ExitCare Patient Information 2015 [employer]. This information is not intended to replace advice given to you by your health care provider. Make sure you discuss any questions you have with your health care provider.  

## 2014-02-23 NOTE — Progress Notes (Signed)
Patient ID: Sierra ButteryJoyce P Riley, female   DOB: [birth year], 74 y.o.   MRN: [id number]  Subjective: This patient presents today complaining of painful toenails when walking wearing shoes. She is undergone a recent lower extremity arterial stenting.  Objective: The toenails are elongated, hypertrophic, discolored and tender to palpation 6-10 Keratoses lateral fifth left toe Residual keratoses left heel  Assessment: History of peripheral arterial disease with history of lower extremity arterial stenting Diabetic Symptomatic onychomycoses 10 Listers corn left  Plan: Debridement toenails 10 and keratoses 1 without a bleeding  Reappoint 3 months

## 2014-03-01 ENCOUNTER — Ambulatory Visit (HOSPITAL_COMMUNITY)
Admission: RE | Admit: 2014-03-01 | Discharge: 2014-03-01 | Disposition: A | Discharge status: Home / Self Care | Payer: Medicare Other | Source: Ambulatory Visit | Attending: Cardiology | Admitting: Cardiology

## 2014-03-01 DIAGNOSIS — Z95828 Presence of other vascular implants and grafts: Secondary | ICD-10-CM | POA: Diagnosis not present

## 2014-03-01 DIAGNOSIS — I739 Peripheral vascular disease, unspecified: Secondary | ICD-10-CM | POA: Diagnosis present

## 2014-03-01 DIAGNOSIS — Z959 Presence of cardiac and vascular implant and graft, unspecified: Secondary | ICD-10-CM

## 2014-03-01 NOTE — Progress Notes (Signed)
Arterial Duplex Lower Ext. Bilateral Limited completed following status post stents. [name], BS, RDMS, RVT

## 2014-03-10 ENCOUNTER — Encounter: Payer: Self-pay | Admitting: Physician Assistant

## 2014-03-10 ENCOUNTER — Ambulatory Visit (INDEPENDENT_AMBULATORY_CARE_PROVIDER_SITE_OTHER): Payer: Medicare Other | Admitting: Physician Assistant

## 2014-03-10 VITALS — BP 134/56 | Ht 61.0 in | Wt 133.8 lb

## 2014-03-10 DIAGNOSIS — I2583 Coronary atherosclerosis due to lipid rich plaque: Secondary | ICD-10-CM

## 2014-03-10 DIAGNOSIS — I10 Essential (primary) hypertension: Secondary | ICD-10-CM

## 2014-03-10 DIAGNOSIS — I739 Peripheral vascular disease, unspecified: Secondary | ICD-10-CM

## 2014-03-10 DIAGNOSIS — I2510 Atherosclerotic heart disease of native coronary artery without angina pectoris: Secondary | ICD-10-CM

## 2014-03-10 NOTE — Assessment & Plan Note (Signed)
Continue current treatment. 

## 2014-03-10 NOTE — Patient Instructions (Signed)
Your physician recommends that you schedule a follow-up appointment in: 3 months with Dr Berry. No changes were made today in your therapy. 

## 2014-03-10 NOTE — Assessment & Plan Note (Signed)
No complaints of angina. 

## 2014-03-10 NOTE — Assessment & Plan Note (Signed)
Symptoms have decreased. Recommended the patient start exercising more.

## 2014-03-10 NOTE — Assessment & Plan Note (Addendum)
Lower extremity arterial Dopplers revealed post procedure increased to 0.78 on the right and 0.73 in the left.  Aspirin Plavix

## 2014-03-10 NOTE — Progress Notes (Signed)
Patient ID: Sierra Riley, female   DOB: [birth year], 74 y.o.   MRN: [id number]    Date:  03/10/2014   ID:  Sierra Riley, DOB [birth year], MRN [id number]  PCP:  Gaspar GarbeISOVEC,RICHARD W, MD  Primary Cardiologist:  Allyson SabalBerry   Chief Complaint  Patient presents with  . post hospital    patient reports left sided rib discomfort.     History of Present Illness: Sierra Riley is a 74 y.o. female    The patient is a 74 year old with history of CAD status post intervention by Dr. Alanda AmassWeintraub 07/05/99 on all 3 coronary arteries with stenting of her LAD and circumflex. She has normal LV function and a low risk Myoview 10/08/10.. Cardiac risk factors include remote tobacco abuse having quit in 2001, 2 hypertension, diabetes and hyperlipidemia. She does have chronic renal insufficiency on hemodialysis since last December. She also has known occluded SFA by ultrasound . Recent Dopplers performed 01/11/14 revealed progression of disease in her common femoral and external iliac arteries. She does complain of lifestyle limiting claudication. She presented for PV angiography and potential percutaneous intervention- undergoing successful bilateral external iliac artery stenting for lifestyle limiting claudication with nitinol self expanding stents.  She presents today for posthospital follow-up. Her repeat lower extremity Dopplers show improvement in ABIs from 0.56-0.78 on the right 0.44 0.73 on the left. There is substantial decrease in velocities of the iliac arteries. The patient reports decreased in claudication symptoms. However, she is not walking that much.  The patient currently denies nausea, vomiting, fever, chest pain, shortness of breath, orthopnea, dizziness, PND, cough, congestion, abdominal pain, hematochezia, melena, lower extremity edema  Wt Readings from Last 3 Encounters:  03/10/14 133 lb 12.8 oz (60.691 kg)  02/14/14 137 lb 2 oz (62.2 kg)  02/03/14 135 lb (61.236 kg)     Past Medical History  Diagnosis  Date  . Diabetes mellitus   . Hypertension   . Hyperlipidemia   . CAD (coronary artery disease)   . Parkinson's disease   . Peripheral vascular disease, unspecified   . Chronic kidney disease     started dialysis 12/07/12  . Renal artery stenosis   . Family history of adverse reaction to anesthesia     daughter has difficulty waking   . S/P arterial stent, 02/13/14 bilateral external iliacs-nitinol self expanding stents 02/14/2014    Current Outpatient Prescriptions  Medication Sig Dispense Refill  . acetaminophen (TYLENOL) 325 MG tablet Take 2 tablets (650 mg total) by mouth every 4 (four) hours as needed for headache or mild pain.    Marland Kitchen. amLODipine (NORVASC) 10 MG tablet Take 10 mg by mouth daily.   0  . aspirin EC 325 MG EC tablet Take 1 tablet (325 mg total) by mouth daily. 30 tablet 0  . carbidopa-levodopa (SINEMET IR) 25-100 MG per tablet TAKE ONE TABLET BY MOUTH 4 TIMES DAILY 120 tablet 0  . carbidopa-levodopa (SINEMET IR) 25-100 MG per tablet 1.5 tabs 7am, 1.5 12n, 1 tab at 4pm 1 tab at 8pm   Take three times in the day of HD, Tue/Thur/Sat. 150 tablet 12  . cloNIDine (CATAPRES) 0.3 MG tablet Take 1 tablet by mouth 2 (two) times daily.     . clopidogrel (PLAVIX) 75 MG tablet Take 1 tablet (75 mg total) by mouth daily with breakfast. 30 tablet 6  . ezetimibe-simvastatin (VYTORIN) 10-80 MG per tablet Take 1 tablet by mouth at bedtime.      . insulin aspart protamine-insulin aspart (NOVOLOG  70/30) (70-30) 100 UNIT/ML injection Inject 12 Units into the skin 2 (two) times daily.     . metoprolol (LOPRESSOR) 100 MG tablet Take 100 mg by mouth 2 (two) times daily.     Marland Kitchen NOVOFINE 32G X 6 MM MISC      No current facility-administered medications for this visit.    Allergies:   No Known Allergies  Social History:  The patient  reports that she quit smoking about 14 years ago. Her smoking use included Cigarettes. She has a 3 pack-year smoking history. She has never used smokeless tobacco.  She reports that she does not drink alcohol or use illicit drugs.   Family history:   Family History  Problem Relation Age of Onset  . Kidney disease Mother   . Hypertension Mother   . Heart disease Father   . Other Brother     sarcoidosis  . Stroke Mother   . Heart failure Father     ROS:  Please see the history of present illness.  All other systems reviewed and negative.   PHYSICAL EXAM: VS:  BP 134/56 mmHg  Ht  (1.549 m)  Wt 133 lb 12.8 oz (60.691 kg)  BMI 25.29 kg/m2 Well nourished, well developed, in no acute distress HEENT: Pupils are equal round react to light accommodation extraocular movements are intact.  Neck: no JVDNo cervical lymphadenopathy.  Loud bruit in the left carotid artery-weighted AV fistula Cardiac: Regular rate and rhythm without murmurs rubs or gallops. Lungs:  clear to auscultation bilaterally, no wheezing, rhonchi or rales Ext: no lower extremity edema.  2+ radial and dear tibialis pulses. Skin: warm and dry Neuro:  Grossly normal    ASSESSMENT AND PLAN:  Problem List Items Addressed This Visit    Peripheral arterial disease    Lower extremity arterial Dopplers revealed post procedure increased to 0.78 on the right and 0.73 in the left.  Aspirin Plavix      Essential hypertension    Continue current treatment.      Coronary artery disease    No complaints of angina.      Claudication - Primary    Symptoms have decreased. Recommended the patient start exercising more.

## 2014-03-16 ENCOUNTER — Ambulatory Visit: Payer: 59

## 2014-03-20 ENCOUNTER — Ambulatory Visit
Admission: RE | Admit: 2014-03-20 | Discharge: 2014-03-20 | Disposition: A | Discharge status: Home / Self Care | Payer: Medicare Other | Source: Ambulatory Visit | Attending: Internal Medicine | Admitting: Internal Medicine

## 2014-03-20 DIAGNOSIS — Z1231 Encounter for screening mammogram for malignant neoplasm of breast: Secondary | ICD-10-CM

## 2014-04-06 ENCOUNTER — Ambulatory Visit (INDEPENDENT_AMBULATORY_CARE_PROVIDER_SITE_OTHER): Payer: Medicare Other | Admitting: Nurse Practitioner

## 2014-04-06 ENCOUNTER — Encounter: Payer: Self-pay | Admitting: Nurse Practitioner

## 2014-04-06 VITALS — BP 145/61 | HR 70 | Ht 61.0 in | Wt 140.0 lb

## 2014-04-06 DIAGNOSIS — G20 Parkinson's disease: Secondary | ICD-10-CM | POA: Diagnosis not present

## 2014-04-06 DIAGNOSIS — R269 Unspecified abnormalities of gait and mobility: Secondary | ICD-10-CM | POA: Diagnosis not present

## 2014-04-06 NOTE — Progress Notes (Signed)
I have read the note, and I agree with the clinical assessment and plan.  WILLIS,CHARLES KEITH   

## 2014-04-06 NOTE — Patient Instructions (Signed)
Continue carbidopa levodopa at current dose, on dialysis days take 8 AM dose 30 minutes prior to eating Use cane at all times for safe ambulation to prevent falls Keep blood sugar in good control Follow-up in 6 months

## 2014-04-06 NOTE — Progress Notes (Signed)
[facility]  PATIENT: Sierra Riley DOB: 08-16-1940   REASON FOR VISIT: Follow-up for Parkinson's disease gait abnormality  HISTORY FROM: Patient and husband    HISTORY OF PRESENT ILLNESS: Sierra Riley, 74 year old female returns for follow-up. She was last seen in this office by Dr. Terrace Arabia 07/18/2013.  Sierra Riley was diagnosed with idiopathic Parkinson's disease since 2007, presenting with bilateral hands tremor, right worse than left,also gradual onset of gait difficulty. she also complains of excessive dreaming. She was a patient of Dr. Lissa Morales, she was treated with Sinemet CR 50/200ER at 8:00 AM, 2 PM, 8 PM. she tolerated medication well, but admit not compliant sometimes,she did notice wearing off, especially when she did not take the medicine on time. 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  Sinemet ER was changed to short acting sinemet 25/100 tid since Sept 2013, She does very little cooking. Independent with ADL's. She does not exercise.  She also has past medical history of hypertension, diabetes, She has a history of fluid retention taking Requip was discontinued since 09/28/2012. She is now on dialysis since November 2014. Marland Kitchen She is now taking Sinemet 25/100 4 times daily. She does not take it on a regular basis. She complains of worsening tremor as well as more gait difficulty and freezing spells before next dose. She is using a single-point cane to ambulate. She has not had any falls since last seen. She goes to dialysis 3 days a week   UPDATE July 18 2013: She has HD T/Thu/Sat 11:30am till 4pm since 11/2012, she  is taking sinemet 25/100, 7:30am, 4pm, 6pm, 8pm. But often missed her dose on the day of HD, because she spend a lot of time in HD units.    REVIEW OF SYSTEMS: Full 14 system review of systems performed and notable only for those listed, all others are neg:  Constitutional: neg  Cardiovascular: neg Ear/Nose/Throat: neg  Skin: neg Eyes: Blurred vision Respiratory: neg Gastroitestinal: Constipation  Hematology/Lymphatic: neg  Endocrine: neg Musculoskeletal: Muscle cramps, difficulty walking Allergy/Immunology: neg Neurological: Tremors, weakness Psychiatric: neg Sleep : neg   ALLERGIES: No Known Allergies  HOME MEDICATIONS: Outpatient Prescriptions Prior to Visit  Medication Sig Dispense Refill  . acetaminophen (TYLENOL) 325 MG tablet Take 2 tablets (650 mg total) by mouth every 4 (four) hours as needed for headache or mild pain.    Marland Kitchen amLODipine (NORVASC) 10 MG tablet Take 10 mg by mouth daily.   0  . carbidopa-levodopa (SINEMET IR) 25-100 MG per tablet 1.5 tabs 7am, 1.5 12n, 1 tab at 4pm 1 tab at 8pm   Take three times in the day of HD, Tue/Thur/Sat. 150 tablet 12  . cloNIDine (CATAPRES) 0.3 MG tablet Take 1 tablet by mouth 2 (two) times daily.     . clopidogrel (PLAVIX) 75 MG tablet Take 1 tablet (75 mg total) by mouth daily with breakfast. 30 tablet 6  . ezetimibe-simvastatin (VYTORIN) 10-80 MG per tablet Take 1 tablet by mouth at bedtime.      . insulin aspart protamine-insulin aspart (NOVOLOG 70/30) (70-30) 100 UNIT/ML injection Inject 12 Units into the skin 2 (two) times daily.     Marland Kitchen  metoprolol (LOPRESSOR) 100 MG tablet Take 100 mg by mouth 2 (two) times daily.     . carbidopa-levodopa (SINEMET IR) 25-100 MG per tablet TAKE ONE TABLET BY MOUTH 4 TIMES DAILY 120 tablet 0  . NOVOFINE 32G X 6 MM MISC     . aspirin EC 325 MG EC tablet Take 1 tablet (325 mg total) by mouth daily. 30 tablet 0   No facility-administered medications prior to visit.    PAST MEDICAL  HISTORY: Past Medical History  Diagnosis Date  . Diabetes mellitus   . Hypertension   . Hyperlipidemia   . CAD (coronary artery disease)   . Parkinson's disease   . Peripheral vascular disease, unspecified   . Chronic kidney disease     started dialysis 12/07/12  . Renal artery stenosis   . Family history of adverse reaction to anesthesia     daughter has difficulty waking   . S/P arterial stent, 02/13/14 bilateral external iliacs-nitinol self expanding stents 02/14/2014    PAST SURGICAL HISTORY: Past Surgical History  Procedure Laterality Date  . Av fistula placement  08/27/2010    left B-C AVF  . Coronary angioplasty with stent placement  07/06/1999    mid dominant CX stent and PTCA side branch large PABG. Successful PTCA  and stent high grade proximal mid left anterior descending artery stenosis.. Successful PTCA high grade proximal nondominant RCA .   Marland Kitchen. Gallbladder surgery  1985  . [place] echocardiography  05/22/2011    mild diastolic dysfunction,mildly dilated LA,mild MR and TR  . Nm myocar perf wall motion  10/08/2010    low risk scan; mild ischemia in a small anterolateral segment  . Iliac artery stent Bilateral 02/13/2014    dr berry  . Lower extremity angiogram N/A 02/13/2014    Procedure: LOWER EXTREMITY ANGIOGRAM;  Surgeon: Runell GessJonathan J Berry, MD;  [facility];  Service: Cardiovascular;  Laterality: N/A;    FAMILY HISTORY: Family History  Problem Relation Age of Onset  . Kidney disease Mother   . Hypertension Mother   . Heart disease Father   . Other Brother     sarcoidosis  . Stroke Mother   . Heart failure Father     SOCIAL HISTORY: History   Social History  . Marital Status: Married    Spouse Name: Fayrene FearingJames  . Number of Children: 4  . Years of Education: 12   Occupational History  .      retired   Social History Main Topics  . Smoking status: Former Smoker -- 0.30 packs/day for 10 years    Types: Cigarettes    Quit date: 07/18/1999  . Smokeless  tobacco: Never Used  . Alcohol Use: No  . Drug Use: No  . Sexual Activity: Not on file   Other Topics Concern  . Not on file   Social History Narrative   Patient lives at home with her husband [name](James). Patient is retired, [employer] education.   Patient 1/2 cup of caffeine daily.   Right handed.     PHYSICAL EXAM  Filed Vitals:   04/06/14 0947  BP: 145/61  Pulse: 70  Height: 5\' 1"  (1.549 m)  Weight: 140 lb (63.504 kg)   Body mass index is 26.47 kg/(m^2). Generalized: Well developed, in no acute distress , masking of the face Head: normocephalic and atraumatic,. Oropharynx benign  Neck: Supple, no carotid bruits  Cardiac: Regular rate rhythm, no murmur  Musculoskeletal: No deformity   Neurological examination  Mentation: Alert oriented to time, place, history taking. Follows all commands speech and language fluent  Cranial nerve II-XII: Pupils were equal round reactive to light extraocular movements were full, visual field were full on confrontational test. Facial sensation and strength were normal. hearing was intact to finger rubbing bilaterally. Uvula tongue midline. head turning and shoulder shrug were normal and symmetric.Tongue protrusion into cheek strength was normal. Motor: normal bulk and tone, full strength in the BUE, BLE, fine finger, intermittent bilateral resting tremor, cogwheeling at wrist and elbows left worse than right, mild bradykinesia . Coordination: finger-nose-finger, heel-to-shin bilaterally, no dysmetria Reflexes: Brachioradialis 2/2, biceps 2/2, triceps 2/2, patellar 2/2, Achilles trace plantar responses were flexor bilaterally. Gait and Station: Rising up from seated position without assistance, wide based, ambulate with a single-point cane no freezing  spells upon turning,  mild retropulsion instability    DIAGNOSTIC DATA (LABS, IMAGING, TESTING) - I reviewed patient records, labs, notes, testing and imaging myself where available.  Lab  Results  Component Value Date   WBC 8.4 02/14/2014   HGB 11.8* 02/14/2014   HCT 35.6* 02/14/2014   MCV 87.9 02/14/2014   PLT 190 02/14/2014      Component Value Date/Time   NA 132* 02/14/2014 0443   K 5.9* 02/14/2014 0443   CL 95* 02/14/2014 0443   CO2 24 02/14/2014 0443   GLUCOSE 211* 02/14/2014 0443   BUN 67* 02/14/2014 0443   CREATININE 7.82* 02/14/2014 0443   CREATININE 7.76* 02/07/2014 0934   CALCIUM 7.9* 02/14/2014 0443   GFRNONAA 5* 02/14/2014 0443   GFRAA 5* 02/14/2014 0443    ASSESSMENT AND PLAN  74 y.o. year old female  has a past medical history of Diabetes mellitus; Hypertension; Hyperlipidemia; CAD (coronary artery disease); Parkinson's disease; Peripheral vascular disease, unspecified; Chronic kidney disease; Renal artery stenosis;  and S/P arterial stent, 02/13/14 bilateral external iliacs-nitinol self expanding stents (02/14/2014). here to follow-up for her Parkinson's disease.The patient is a current patient of Dr.Yan  who is out of the office today . This note is sent to the work in doctor.     Continue carbidopa levodopa at current dose, on dialysis days take 8 AM dose 30 minutes prior to eating dialysis appointment usually around 12noon.  Use cane at all times for safe ambulation to prevent falls Keep blood sugar in good control recent reading greater than 200-which is  abnormal  Follow-up in 6 months Sierra Riley, [facility], [facility], APRN  [facility] [address], [address] [place], [place] [address] [phone]

## 2014-05-24 ENCOUNTER — Ambulatory Visit: Payer: Medicare Other | Admitting: Podiatry

## 2014-06-09 ENCOUNTER — Encounter: Payer: Self-pay | Admitting: Cardiovascular Disease

## 2014-06-09 ENCOUNTER — Ambulatory Visit (INDEPENDENT_AMBULATORY_CARE_PROVIDER_SITE_OTHER): Payer: Medicare Other | Admitting: Cardiovascular Disease

## 2014-06-09 DIAGNOSIS — I10 Essential (primary) hypertension: Secondary | ICD-10-CM | POA: Diagnosis not present

## 2014-06-09 DIAGNOSIS — R0989 Other specified symptoms and signs involving the circulatory and respiratory systems: Secondary | ICD-10-CM | POA: Diagnosis not present

## 2014-06-09 DIAGNOSIS — I739 Peripheral vascular disease, unspecified: Secondary | ICD-10-CM | POA: Diagnosis not present

## 2014-06-09 NOTE — Assessment & Plan Note (Signed)
History of hyperlipidemia on Vytorin followed by her PCP 

## 2014-06-09 NOTE — Patient Instructions (Signed)
Your physician recommends that you schedule a follow-up appointment in: 6 Months with APP and 12 Months with Dr Allyson SabalBerry  Your physician has requested that you have a carotid duplex. This test is an ultrasound of the carotid arteries in your neck. It looks at blood flow through these arteries that supply the brain with blood. Allow one hour for this exam. There are no restrictions or special instructions. In August  Your physician has requested that you have a lower extremity arterial duplex. This test is an ultrasound of the arteries in the legs. It looks at arterial blood flow in the legs. Allow one hour for Lower Arterial scans. There are no restrictions or special instructions. In August

## 2014-06-09 NOTE — Progress Notes (Signed)
06/09/2014 Sierra Riley   Jul 18, 1940  [id number]  Primary Physician Gaspar GarbeISOVEC,RICHARD W, MD Primary Cardiologist: Runell GessJonathan J. Ceirra Belli MD Roseanne RenoFACP,FACC,FAHA, FSCAI   HPI:  [name] is a 74 year old appearing married African American female mother of 4, grandmother of 9 grandchildren he was formally a patient Dr. Kandis CockingWeintraub's. Her primary care physician is Dr. Wylene Simmerisovec .  i left circumflex saw her in the office 02/03/14.Her history is remarkable for CAD status post intervention by Dr. Alanda AmassWeintraub 07/05/99 on all 3 coronary arteries with stenting of her LAD and circumflex. She has normal LV function and a low risk Myoview 10/08/10.. Cardiac risk factors include remote tobacco abuse having quit in 2001, 2 hypertension, diabetes and hyperlipidemia. She does have chronic renal insufficiency on hemodialysis since last December. She also has known occluded SFA by ultrasound . Recent Dopplers performed 01/6614 revealed progression of disease in her common femoral and external iliac arteries. She does complain of lifestyle limiting claudication. She denies chest pain or shortness of breath. I performed peripheral angiography on her 02/13/14 and stented both external iliac arteries. I demonstrated bilateral SFA occlusions with two-vessel runoff bilaterally. Her claudication somewhat improved but she still has calf claudication due to her SFA occlusions. She does not wish to pursue percutaneous revascularization of these at this time.   Current Outpatient Prescriptions  Medication Sig Dispense Refill  . acetaminophen (TYLENOL) 325 MG tablet Take 2 tablets (650 mg total) by mouth every 4 (four) hours as needed for headache or mild pain.    Marland Kitchen. amLODipine (NORVASC) 10 MG tablet Take 10 mg by mouth daily.   0  . aspirin 81 MG tablet Take 81 mg by mouth daily.    . carbidopa-levodopa (SINEMET IR) 25-100 MG per tablet 1.5 tabs 7am, 1.5 12n, 1 tab at 4pm 1 tab at 8pm   Take three times in the day of HD, Tue/Thur/Sat. 150  tablet 12  . cloNIDine (CATAPRES) 0.3 MG tablet Take 1 tablet by mouth 2 (two) times daily.     . clopidogrel (PLAVIX) 75 MG tablet Take 1 tablet (75 mg total) by mouth daily with breakfast. 30 tablet 6  . ezetimibe-simvastatin (VYTORIN) 10-80 MG per tablet Take 1 tablet by mouth at bedtime.      . insulin aspart protamine-insulin aspart (NOVOLOG 70/30) (70-30) 100 UNIT/ML injection Inject 12 Units into the skin 2 (two) times daily.     . Lanthanum Carbonate (FOSRENOL PO) Take 1 tablet by mouth 3 (three) times daily.    . metoprolol (LOPRESSOR) 100 MG tablet Take 100 mg by mouth 2 (two) times daily.     Marland Kitchen. NOVOFINE 32G X 6 MM MISC     . NOVOLOG MIX 70/30 FLEXPEN (70-30) 100 UNIT/ML FlexPen 14 Units 2 (two) times daily.     No current facility-administered medications for this visit.    No Known Allergies  History   Social History  . Marital Status: Married    Spouse Name: Fayrene FearingJames  . Number of Children: 4  . Years of Education: 12   Occupational History  .      retired   Social History Main Topics  . Smoking status: Former Smoker -- 0.30 packs/day for 10 years    Types: Cigarettes    Quit date: 07/18/1999  . Smokeless tobacco: Never Used  . Alcohol Use: No  . Drug Use: No  . Sexual Activity: Not on file   Other Topics Concern  . Not on file   Social History  Narrative   Patient lives at home with her husband [name]). Patient is retired, [employer].   Patient 1/2 cup of caffeine daily.   Right handed.     Review of Systems: General: negative for chills, fever, night sweats or weight changes.  Cardiovascular: negative for chest pain, dyspnea on exertion, edema, orthopnea, palpitations, paroxysmal nocturnal dyspnea or shortness of breath Dermatological: negative for rash Respiratory: negative for cough or wheezing Urologic: negative for hematuria Abdominal: negative for nausea, vomiting, diarrhea, bright red blood per rectum, melena, or hematemesis Neurologic:  negative for visual changes, syncope, or dizziness All other systems reviewed and are otherwise negative except as noted above.    Blood pressure 110/50, pulse 68, height  (1.549 m), weight 136 lb 14.4 oz (62.097 kg).  General appearance: alert and no distress Neck: no adenopathy, no JVD, supple, symmetrical, trachea midline, thyroid not enlarged, symmetric, no tenderness/mass/nodules and bilateral carotid bruits left greater than right Lungs: clear to auscultation bilaterally Heart: regular rate and rhythm, S1, S2 normal, no murmur, click, rub or gallop Extremities: extremities normal, atraumatic, no cyanosis or edema  EKG not performed today  ASSESSMENT AND PLAN:   S/P arterial stent, 02/13/14 bilateral external iliacs-nitinol self expanding stents History of PAD status post angiography by myself 02/13/14 revealing high-grade bilateral external iliac artery stenosis and total SFAs bilaterally with 2 vessel runoff. I ended up stenting both external iliac arteries which resulted in improvement in her claudication symptoms. Her ABIs performed postintervention were in the 0.7 range bilaterally with improved velocities. She still complains of calf claudication but does not walk much and does not wish to pursue SFA intervention at this time.   Hyperlipidemia History of hyperlipidemia on Vytorin followed by her PCP   Essential hypertension History of hypertension with blood pressure measured at 110/50. She is on amlodipine, clonidine and metoprolol. Continue current meds at current dosing   Coronary artery disease History of CAD status post coronary intervention by Dr. Alanda Amass 07/05/99 on all 3 coronary arteries. She has normal LV function. Her last Myoview performed 10/08/10 was low risk and nonischemic.       Runell Gess MD FACP,FACC,FAHA, [facility] 06/09/2014 10:31 AM

## 2014-06-09 NOTE — Assessment & Plan Note (Signed)
History of CAD status post coronary intervention by Dr. Alanda AmassWeintraub 07/05/99 on all 3 coronary arteries. She has normal LV function. Her last Myoview performed 10/08/10 was low risk and nonischemic.

## 2014-06-09 NOTE — Assessment & Plan Note (Signed)
History of hypertension with blood pressure measured at 110/50. She is on amlodipine, clonidine and metoprolol. Continue current meds at current dosing

## 2014-06-09 NOTE — Assessment & Plan Note (Signed)
History of PAD status post angiography by myself 02/13/14 revealing high-grade bilateral external iliac artery stenosis and total SFAs bilaterally with 2 vessel runoff. I ended up stenting both external iliac arteries which resulted in improvement in her claudication symptoms. Her ABIs performed postintervention were in the 0.7 range bilaterally with improved velocities. She still complains of calf claudication but does not walk much and does not wish to pursue SFA intervention at this time.

## 2014-06-14 ENCOUNTER — Other Ambulatory Visit: Payer: Self-pay | Admitting: *Deleted

## 2014-06-14 DIAGNOSIS — I739 Peripheral vascular disease, unspecified: Secondary | ICD-10-CM

## 2014-08-02 ENCOUNTER — Emergency Department (HOSPITAL_COMMUNITY)
Admission: EM | Admit: 2014-08-02 | Discharge: 2014-08-02 | Disposition: A | Discharge status: Home / Self Care | Payer: Medicare Other | Attending: Emergency Medicine | Admitting: Emergency Medicine

## 2014-08-02 ENCOUNTER — Emergency Department (HOSPITAL_COMMUNITY): Payer: Medicare Other

## 2014-08-02 ENCOUNTER — Encounter (HOSPITAL_COMMUNITY): Payer: Self-pay | Admitting: Family Medicine

## 2014-08-02 DIAGNOSIS — S0990XA Unspecified injury of head, initial encounter: Secondary | ICD-10-CM | POA: Diagnosis not present

## 2014-08-02 DIAGNOSIS — I2510 Atherosclerotic heart disease of native coronary artery without angina pectoris: Secondary | ICD-10-CM | POA: Insufficient documentation

## 2014-08-02 DIAGNOSIS — W01198A Fall on same level from slipping, tripping and stumbling with subsequent striking against other object, initial encounter: Secondary | ICD-10-CM | POA: Diagnosis not present

## 2014-08-02 DIAGNOSIS — Z79899 Other long term (current) drug therapy: Secondary | ICD-10-CM | POA: Diagnosis not present

## 2014-08-02 DIAGNOSIS — E785 Hyperlipidemia, unspecified: Secondary | ICD-10-CM | POA: Insufficient documentation

## 2014-08-02 DIAGNOSIS — Z9861 Coronary angioplasty status: Secondary | ICD-10-CM | POA: Diagnosis not present

## 2014-08-02 DIAGNOSIS — Z992 Dependence on renal dialysis: Secondary | ICD-10-CM | POA: Insufficient documentation

## 2014-08-02 DIAGNOSIS — Z7982 Long term (current) use of aspirin: Secondary | ICD-10-CM | POA: Insufficient documentation

## 2014-08-02 DIAGNOSIS — Z23 Encounter for immunization: Secondary | ICD-10-CM | POA: Diagnosis not present

## 2014-08-02 DIAGNOSIS — Z9889 Other specified postprocedural states: Secondary | ICD-10-CM | POA: Diagnosis not present

## 2014-08-02 DIAGNOSIS — I120 Hypertensive chronic kidney disease with stage 5 chronic kidney disease or end stage renal disease: Secondary | ICD-10-CM | POA: Insufficient documentation

## 2014-08-02 DIAGNOSIS — E119 Type 2 diabetes mellitus without complications: Secondary | ICD-10-CM | POA: Insufficient documentation

## 2014-08-02 DIAGNOSIS — Z794 Long term (current) use of insulin: Secondary | ICD-10-CM | POA: Insufficient documentation

## 2014-08-02 DIAGNOSIS — Y9389 Activity, other specified: Secondary | ICD-10-CM | POA: Insufficient documentation

## 2014-08-02 DIAGNOSIS — Y9289 Other specified places as the place of occurrence of the external cause: Secondary | ICD-10-CM | POA: Diagnosis not present

## 2014-08-02 DIAGNOSIS — S0101XA Laceration without foreign body of scalp, initial encounter: Secondary | ICD-10-CM | POA: Diagnosis not present

## 2014-08-02 DIAGNOSIS — G20 Parkinson's disease: Secondary | ICD-10-CM | POA: Insufficient documentation

## 2014-08-02 DIAGNOSIS — Z87891 Personal history of nicotine dependence: Secondary | ICD-10-CM | POA: Diagnosis not present

## 2014-08-02 DIAGNOSIS — N186 End stage renal disease: Secondary | ICD-10-CM | POA: Insufficient documentation

## 2014-08-02 DIAGNOSIS — Y998 Other external cause status: Secondary | ICD-10-CM | POA: Insufficient documentation

## 2014-08-02 MED ORDER — LIDOCAINE HCL (PF) 1 % IJ SOLN
10.0000 mL | Freq: Once | INTRAMUSCULAR | Status: AC
  Administered 2014-08-02: 10 mL via INTRADERMAL
  Filled 2014-08-02: qty 10

## 2014-08-02 MED ORDER — TETANUS-DIPHTH-ACELL PERTUSSIS 5-2.5-18.5 LF-MCG/0.5 IM SUSP
0.5000 mL | Freq: Once | INTRAMUSCULAR | Status: AC
  Administered 2014-08-02: 0.5 mL via INTRAMUSCULAR
  Filled 2014-08-02: qty 0.5

## 2014-08-02 NOTE — ED Provider Notes (Signed)
Patient with chronically unsteady gait. She lost her footing this morning and fell striking her head. She has suffered laceration to her scalp as result of event. Patient presently alert Glasgow Coma Score 15. No distress Patient has tremor at rest. Moves all extremities well  Doug Sou, MD 08/02/14 1200

## 2014-08-02 NOTE — ED Notes (Signed)
PA at bedside.

## 2014-08-02 NOTE — ED Notes (Signed)
Pt here with laceration to head after fall this am. sts she just fell. Pt has parkinson's. Bleeding controlled. Pt on blood thinners.

## 2014-08-02 NOTE — Discharge Instructions (Signed)
Pleas have your staples removed in a week to 10 days. Read the information below for home care. Please avoid falls by using your walker at all times.    Staple Care and Removal Your caregiver has used staples today to repair your wound. Staples are used to help a wound heal faster by holding the edges of the wound together. The staples can be removed when the wound has healed well enough to stay together after the staples are removed. A dressing (wound covering), depending on the location of the wound, may have been applied. This may be changed once per day or as instructed. If the dressing sticks, it may be soaked off with soapy water or hydrogen peroxide. Only take over-the-counter or prescription medicines for pain, discomfort, or fever as directed by your caregiver.  If you did not receive a tetanus shot today because you did not recall when your last one was given, check with your caregiver when you have your staples removed to determine if one is needed. Return to your caregiver's office in 1 week or as suggested to have your staples removed. SEEK IMMEDIATE MEDICAL CARE IF:   You have redness, swelling, or increasing pain in the wound.  You have pus coming from the wound.  You have a fever.  You notice a bad smell coming from the wound or dressing.  Your wound edges break open after staples have been removed. Document Released: 09/17/2000 Document Revised: 03/17/2011 Document Reviewed: 10/02/2004 [facility] Patient Information 2015 [place], [place]. This information is not intended to replace advice given to you by your health care provider. Make sure you discuss any questions you have with your health care provider.   Head Injury You have received a head injury. It does not appear serious at this time. Headaches and vomiting are common following head injury. It should be easy to awaken from sleeping. Sometimes it is necessary for you to stay in the emergency department for a while for  observation. Sometimes admission to the hospital may be needed. After injuries such as yours, most problems occur within the first 24 hours, but side effects may occur up to 7-10 days after the injury. It is important for you to carefully monitor your condition and contact your health care provider or seek immediate medical care if there is a change in your condition. WHAT ARE THE TYPES OF HEAD INJURIES? Head injuries can be as minor as a bump. Some head injuries can be more severe. More severe head injuries include:  A jarring injury to the brain (concussion).  A bruise of the brain (contusion). This mean there is bleeding in the brain that can cause swelling.  A cracked skull (skull fracture).  Bleeding in the brain that collects, clots, and forms a bump (hematoma). WHAT CAUSES A HEAD INJURY? A serious head injury is most likely to happen to someone who is in a car wreck and is not wearing a seat belt. Other causes of major head injuries include bicycle or motorcycle accidents, sports injuries, and falls. HOW ARE HEAD INJURIES DIAGNOSED? A complete history of the event leading to the injury and your current symptoms will be helpful in diagnosing head injuries. Many times, pictures of the brain, such as CT or MRI are needed to see the extent of the injury. Often, an overnight hospital stay is necessary for observation.  WHEN SHOULD I SEEK IMMEDIATE MEDICAL CARE?  You should get help right away if:  You have confusion or drowsiness.  You feel  sick to your stomach (nauseous) or have continued, forceful vomiting.  You have dizziness or unsteadiness that is getting worse.  You have severe, continued headaches not relieved by medicine. Only take over-the-counter or prescription medicines for pain, fever, or discomfort as directed by your health care provider.  You do not have normal function of the arms or legs or are unable to walk.  You notice changes in the black spots in the center of the  colored part of your eye (pupil).  You have a clear or bloody fluid coming from your nose or ears.  You have a loss of vision. During the next 24 hours after the injury, you must stay with someone who can watch you for the warning signs. This person should contact local emergency services (911 in the [place] if you have seizures, you become unconscious, or you are unable to wake up. HOW CAN I PREVENT A HEAD INJURY IN THE FUTURE? The most important factor for preventing major head injuries is avoiding motor vehicle accidents. To minimize the potential for damage to your head, it is crucial to wear seat belts while riding in motor vehicles. Wearing helmets while bike riding and playing collision sports (like football) is also helpful. Also, avoiding dangerous activities around the house will further help reduce your risk of head injury.  WHEN CAN I RETURN TO NORMAL ACTIVITIES AND ATHLETICS? You should be reevaluated by your health care provider before returning to these activities. If you have any of the following symptoms, you should not return to activities or contact sports until 1 week after the symptoms have stopped:  Persistent headache.  Dizziness or vertigo.  Poor attention and concentration.  Confusion.  Memory problems.  Nausea or vomiting.  Fatigue or tire easily.  Irritability.  Intolerant of bright lights or loud noises.  Anxiety or depression.  Disturbed sleep. MAKE SURE YOU:   Understand these instructions.  Will watch your condition.  Will get help right away if you are not doing well or get worse. Document Released: 12/23/2004 Document Revised: 12/28/2012 Document Reviewed: 08/30/2012 [facility] Patient Information 2015 [place], [place]. This information is not intended to replace advice given to you by your health care provider. Make sure you discuss any questions you have with your health care provider.

## 2014-08-02 NOTE — ED Provider Notes (Signed)
CSN: [id number]     Arrival date & time 08/02/14  1026 History   First MD Initiated Contact with Patient 08/02/14 1128     Chief Complaint  Patient presents with  . Fall  . Head Laceration     (Consider location/radiation/quality/duration/timing/severity/associated sxs/prior Treatment) HPI   Sierra Riley This is a 74 year old female with a past medical history of Parkinson's disease, diabetes, hypertension, peripheral vascular disease, chronic kidney disease who presents emergency Department with chief complaint of scalp laceration. The patient normally uses a walker at home. However, was ambulating without it. Today when she lost her balance and fell backward hitting her head. She complains of bleeding from the scalp. She denies any headaches, changes in vision, difficulty with speech, vertigo or other neurologic symptoms. She is on an anticoagulant. Her husband helped clean the wound and brought her here to the emergency department.  Past Medical History  Diagnosis Date  . Diabetes mellitus   . Hypertension   . Hyperlipidemia   . CAD (coronary artery disease)   . Parkinson's disease   . Peripheral vascular disease, unspecified   . Chronic kidney disease     started dialysis 12/07/12  . Renal artery stenosis   . Family history of adverse reaction to anesthesia     daughter has difficulty waking   . S/P arterial stent, 02/13/14 bilateral external iliacs-nitinol self expanding stents 02/14/2014   Past Surgical History  Procedure Laterality Date  . Av fistula placement  08/27/2010    left B-C AVF  . Coronary angioplasty with stent placement  07/06/1999    mid dominant CX stent and PTCA side branch large PABG. Successful PTCA  and stent high grade proximal mid left anterior descending artery stenosis.. Successful PTCA high grade proximal nondominant RCA .   Marland Kitchen Gallbladder surgery  1985  . US echocardiography  05/22/2011    mild diastolic dysfunction,mildly dilated LA,mild MR and TR  .  Nm myocar perf wall motion  10/08/2010    low risk scan; mild ischemia in a small anterolateral segment  . Iliac artery stent Bilateral 02/13/2014    dr berry  . Lower extremity angiogram N/A 02/13/2014    Procedure: LOWER EXTREMITY ANGIOGRAM;  Surgeon: Runell Gess, MD;  Location: [facility] CATH LAB;  Service: Cardiovascular;  Laterality: N/A;   Family History  Problem Relation Age of Onset  . Kidney disease Mother   . Hypertension Mother   . Heart disease Father   . Other Brother     sarcoidosis  . Stroke Mother   . Heart failure Father    History  Substance Use Topics  . Smoking status: Former Smoker -- 0.30 packs/day for 10 years    Types: Cigarettes    Quit date: 07/18/1999  . Smokeless tobacco: Never Used  . Alcohol Use: No   OB History    No data available     Review of Systems  Ten systems reviewed and are negative for acute change, except as noted in the HPI.  \  Allergies  Review of patient's allergies indicates no known allergies.  Home Medications   Prior to Admission medications   Medication Sig Start Date End Date Taking? Authorizing Provider  acetaminophen (TYLENOL) 325 MG tablet Take 2 tablets (650 mg total) by mouth every 4 (four) hours as needed for headache or mild pain. 02/14/14  Yes Leone Brand, NP  amLODipine (NORVASC) 10 MG tablet Take 10 mg by mouth daily.  01/22/14  Yes Historical Provider,  MD  aspirin 81 MG tablet Take 81 mg by mouth daily.   Yes Historical Provider, MD  carbidopa-levodopa (SINEMET IR) 25-100 MG per tablet 1.5 tabs 7am, 1.5 12n, 1 tab at 4pm 1 tab at 8pm   Take three times in the day of HD, Tue/Thur/Sat. Patient taking differently: Take 1-1.5 tablets by mouth 4 (four) times daily. 1.5 tabs 7am, 1.5 12n, 1 tab at 4pm 1 tab at 8pm   Take three times in the day of HD, Tue/Thur/Sat. 07/18/13  Yes Levert Feinstein, MD  cloNIDine (CATAPRES) 0.3 MG tablet Take 1 tablet by mouth 2 (two) times daily.  10/07/10  Yes Historical Provider, MD   clopidogrel (PLAVIX) 75 MG tablet Take 1 tablet (75 mg total) by mouth daily with breakfast. 02/14/14  Yes Leone Brand, NP  ezetimibe-simvastatin (VYTORIN) 10-80 MG per tablet Take 1 tablet by mouth at bedtime.     Yes Historical Provider, MD  insulin aspart protamine-insulin aspart (NOVOLOG 70/30) (70-30) 100 UNIT/ML injection Inject 14 Units into the skin 2 (two) times daily.    Yes Historical Provider, MD  Lanthanum Carbonate (FOSRENOL PO) Take 1 tablet by mouth 3 (three) times daily.   Yes Historical Provider, MD  metoprolol (LOPRESSOR) 100 MG tablet Take 100 mg by mouth 2 (two) times daily.  01/18/14  Yes Historical Provider, MD  NOVOFINE 32G X 6 MM MISC  03/04/13   Historical Provider, MD  NOVOLOG MIX 70/30 FLEXPEN (70-30) 100 UNIT/ML FlexPen 14 Units 2 (two) times daily. 03/12/14   Historical Provider, MD   BP 170/76 mmHg  Pulse 67  Temp(Src) 98 F (36.7 C)  Resp 17  SpO2 96% Physical Exam  Constitutional: She is oriented to person, place, and time. She appears well-developed and well-nourished. No distress.  HENT:  Head: Normocephalic.  5 cm superficial scalp laceration in the right parieto-occipital region.  Eyes: Conjunctivae are normal. No scleral icterus.  Neck: Normal range of motion.  Cardiovascular: Normal rate, regular rhythm, normal heart sounds and intact distal pulses.  Exam reveals no gallop and no friction rub.   No murmur heard. Pulmonary/Chest: Effort normal and breath sounds normal. No respiratory distress.  Abdominal: Soft. Bowel sounds are normal. She exhibits no distension and no mass. There is no tenderness. There is no guarding.  Neurological: She is alert and oriented to person, place, and time. She displays normal reflexes. No cranial nerve deficit. She exhibits normal muscle tone. Coordination normal.  Resting tremor  Skin: Skin is warm and dry. She is not diaphoretic.  Nursing note and vitals reviewed.   ED Course  Procedures (including critical care  time) Labs Review Labs Reviewed - No data to display  Imaging Review Ct Head Wo Contrast  08/02/2014   CLINICAL DATA:  Fall, scalp laceration posteriorly, history Parkinson's, fall on blood thinners.  EXAM: CT HEAD WITHOUT CONTRAST  CT CERVICAL SPINE WITHOUT CONTRAST  TECHNIQUE: Multidetector CT imaging of the head and cervical spine was performed following the standard protocol without intravenous contrast. Multiplanar CT image reconstructions of the cervical spine were also generated.  COMPARISON:  None.  FINDINGS: CT HEAD FINDINGS  Diffuse brain atrophy noted with chronic white matter microvascular ischemic changes about the ventricles. No acute intracranial hemorrhage, definite acute infarction, mass lesion, midline shift, herniation, hydrocephalus, or extra-axial fluid collection. No focal mass effect or edema. Gray-white matter differentiation maintained. Cisterns are patent. Cerebellar atrophy as well. Atherosclerosis of the intracranial vessels of the skullbase. Mastoids and sinuses remain clear. Skull  appears intact. Right posterior parietal scalp injury noted.  CT CERVICAL SPINE FINDINGS  Straightened cervical spine alignment may be positional. Multilevel cervical spondylosis and degenerative change, most apparent C5-6 and C6-7. No acute fracture or malalignment. Diffuse facet arthropathy at multiple levels. Intact odontoid and normal prevertebral soft tissues. Left cervical rib noted at C7. Clear lung apices. Subclavian and carotid atherosclerosis noted.  Nodular enlargement of the intra thyroid with calcifications. Recommend follow-up nonemergent thyroid ultrasound.  IMPRESSION: Right posterior parietal scalp entry.  Chronic brain atrophy and white matter microvascular ischemic changes.  No acute intracranial process or hemorrhage.  No acute fracture or osseous finding of the cervical spine. Diffuse cervical spondylosis, degenerative change and facet arthropathy.  Inferior thyroid enlarged with  calcifications. Recommend nonemergent thyroid ultrasound.   Electronically Signed   By: Judie Petit.  Shick M.D.   On: 08/02/2014 13:54   Ct Cervical Spine Wo Contrast  08/02/2014   CLINICAL DATA:  Fall, scalp laceration posteriorly, history Parkinson's, fall on blood thinners.  EXAM: CT HEAD WITHOUT CONTRAST  CT CERVICAL SPINE WITHOUT CONTRAST  TECHNIQUE: Multidetector CT imaging of the head and cervical spine was performed following the standard protocol without intravenous contrast. Multiplanar CT image reconstructions of the cervical spine were also generated.  COMPARISON:  None.  FINDINGS: CT HEAD FINDINGS  Diffuse brain atrophy noted with chronic white matter microvascular ischemic changes about the ventricles. No acute intracranial hemorrhage, definite acute infarction, mass lesion, midline shift, herniation, hydrocephalus, or extra-axial fluid collection. No focal mass effect or edema. Gray-white matter differentiation maintained. Cisterns are patent. Cerebellar atrophy as well. Atherosclerosis of the intracranial vessels of the skullbase. Mastoids and sinuses remain clear. Skull appears intact. Right posterior parietal scalp injury noted.  CT CERVICAL SPINE FINDINGS  Straightened cervical spine alignment may be positional. Multilevel cervical spondylosis and degenerative change, most apparent C5-6 and C6-7. No acute fracture or malalignment. Diffuse facet arthropathy at multiple levels. Intact odontoid and normal prevertebral soft tissues. Left cervical rib noted at C7. Clear lung apices. Subclavian and carotid atherosclerosis noted.  Nodular enlargement of the intra thyroid with calcifications. Recommend follow-up nonemergent thyroid ultrasound.  IMPRESSION: Right posterior parietal scalp entry.  Chronic brain atrophy and white matter microvascular ischemic changes.  No acute intracranial process or hemorrhage.  No acute fracture or osseous finding of the cervical spine. Diffuse cervical spondylosis, degenerative  change and facet arthropathy.  Inferior thyroid enlarged with calcifications. Recommend nonemergent thyroid ultrasound.   Electronically Signed   By: Judie Petit.  Shick M.D.   On: 08/02/2014 13:54     EKG Interpretation None       LACERATION REPAIR Performed by: Arthor Captain Authorized by: Arthor Captain Consent: Verbal consent obtained. Risks and benefits: risks, benefits and alternatives were discussed Consent given by: patient Patient identity confirmed: provided demographic data Prepped and Draped in normal sterile fashion Wound explored  Laceration Location: scalp  Laceration Length: 5 cm  No Foreign Bodies seen or palpated  Anesthesia: local infiltration  Local anesthetic: lidocaine 1 % w/o epinephrine  Anesthetic total: 8 ml  Irrigation method: syringe Amount of cleaning: standard  Skin closure: staples  Number of sutures: 8  Technique: stapling  Patient tolerance: Patient tolerated the procedure well with no immediate complications.   MDM   Final diagnoses:  Head injury    3:49 PM BP 170/76 mmHg  Pulse 67  Temp(Src) 98 F (36.7 C)  Resp 17  SpO2 96% patient's CT head and C-spine are negative for acute intracranial. She did  have some oozing from her dialysis catheter site. Pressure dressing was applied with hemostasis. Patient's scalp laceration repaired with staples. She was able to ambulate with walker easily in the ED. No Neuro deficits at discharge.      Arthor Captain, PA-C 08/02/14 1554  Doug Sou, MD 08/02/14 [phone]

## 2014-08-02 NOTE — ED Notes (Signed)
Spoke with CT states patient is next in line

## 2014-08-02 NOTE — ED Notes (Signed)
Patient's Dialysis cath bleeding. This nurses placed pressure dressing with gauze and paper tape. PA notified.

## 2014-08-02 NOTE — ED Notes (Signed)
unable to get accurate BP due to patient unable to get arms still.

## 2014-08-17 ENCOUNTER — Other Ambulatory Visit: Payer: Self-pay

## 2014-08-17 MED ORDER — CARBIDOPA-LEVODOPA 25-100 MG PO TABS: ORAL_TABLET | ORAL | Status: DC

## 2014-10-06 ENCOUNTER — Ambulatory Visit: Payer: Medicare Other | Admitting: Nurse Practitioner

## 2014-10-09 ENCOUNTER — Encounter: Payer: Self-pay | Admitting: Nurse Practitioner

## 2014-10-09 ENCOUNTER — Ambulatory Visit (INDEPENDENT_AMBULATORY_CARE_PROVIDER_SITE_OTHER): Payer: Medicare Other | Admitting: Nurse Practitioner

## 2014-10-09 VITALS — BP 118/56 | HR 68 | Ht 61.0 in | Wt 142.4 lb

## 2014-10-09 DIAGNOSIS — R269 Unspecified abnormalities of gait and mobility: Secondary | ICD-10-CM | POA: Diagnosis not present

## 2014-10-09 DIAGNOSIS — G20 Parkinson's disease: Secondary | ICD-10-CM

## 2014-10-09 DIAGNOSIS — G20A1 Parkinson's disease without dyskinesia, without mention of fluctuations: Secondary | ICD-10-CM

## 2014-10-09 MED ORDER — CARBIDOPA-LEVODOPA 25-100 MG PO TABS: ORAL_TABLET | ORAL | Status: DC

## 2014-10-09 NOTE — Patient Instructions (Signed)
Continue to walk daily for exercise Continue Sinemet for parkinsons disease Continue rehab exercises as prescribed  F/U in 6 months next visit with Dr. Terrace Arabia

## 2014-10-09 NOTE — Progress Notes (Signed)
[facility]  PATIENT: Sierra Riley DOB: 09-24-1940   REASON FOR VISIT: Follow-up for gait disorder and Parkinson's disease, recent falls  HISTORY FROM: Patient, husband    HISTORY OF PRESENT ILLNESS: 10/09/14 Sierra Riley, 74 year old female returns for follow-up. She has a history of idiopathic Parkinson's disease and gait abnormality. She has had 3 falls since last seen the most recent occurring on 08/02/2014 when she was seen in the emergency room.Reviewed  CT of the head and neck with Chronic brain atrophy and white matter microvascular ischemic changes.No acute intracranial process or hemorrhage.No acute fracture or osseous finding of the cervical spine. Diffusecervical spondylosis, degenerative change and facet arthropathy. She has both a cane, and walker but the husband claims she does not use them as directed in the past. She also has dialysis treatments 3 times a week. Most recent hemoglobin A1c was 8. She had an increase in her insulin dose. She gets no regular exercise and she does not perform any of the exercises that she has been given in the past. She is currently on Sinemet 4 times a day. She remains independent in feeding dressing and bathing. She no longer cooks. She returns for reevaluation  3/31/16[name], 74 year old female returns for follow-up. She was last seen in this office by Dr. Terrace Arabia 07/18/2013. Sierra Riley was diagnosed with idiopathic Parkinson's disease since 2007, presenting with bilateral hands tremor, right worse than left,also gradual onset of gait difficulty. she also complains of excessive dreaming. She was a patient of Dr. Lissa Morales, she was treated with Sinemet CR 50/200ER at 8:00 AM, 2 PM, 8 PM. she tolerated medication well, but admit not compliant sometimes,she did notice wearing off, especially when she did not take the medicine on time. 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  Sinemet ER was changed to short acting sinemet 25/100 tid since Sept 2013, She does very little cooking. Independent with ADL's. She does not exercise.  She also has past medical history of hypertension, diabetes, She has a history of fluid retention taking Requip was discontinued since 09/28/2012. She is now on dialysis since November 2014. Marland Kitchen She is now taking Sinemet 25/100 4 times daily. She does not take it on a regular basis. She complains of worsening tremor as well as more gait difficulty and freezing spells before next dose. She is using a single-point cane to ambulate. She has not had any falls since last seen. She goes to dialysis 3 days a week    REVIEW OF SYSTEMS: Full 14 system review of systems performed and notable only for those listed, all others are neg:  Constitutional: neg  Cardiovascular: neg Ear/Nose/Throat: neg  Skin: neg Eyes: neg Respiratory: neg Gastroitestinal: Nausea and vomiting Hematology/Lymphatic: neg  Endocrine: neg Musculoskeletal: Walking difficulty Allergy/Immunology: neg Neurological: Tremors Psychiatric: neg Sleep : neg   ALLERGIES: No Known Allergies  HOME MEDICATIONS: Outpatient Prescriptions Prior to Visit  Medication Sig Dispense Refill  . acetaminophen (TYLENOL) 325 MG tablet Take 2 tablets (650 mg total) by mouth every 4 (four) hours as needed for headache or mild pain.    Marland Kitchen amLODipine (NORVASC) 10 MG tablet Take 10  mg by mouth daily.   0  . aspirin 81 MG tablet Take 81 mg by mouth daily.    . carbidopa-levodopa (SINEMET IR) 25-100 MG per tablet 1.5 tabs 7am, 1.5 12n, 1 tab at 4pm 1 tab at 8pm 150 tablet 3  . cloNIDine (CATAPRES) 0.3 MG tablet  Take 1 tablet by mouth 2 (two) times daily.     . clopidogrel (PLAVIX) 75 MG tablet Take 1 tablet (75 mg total) by mouth daily with breakfast. 30 tablet 6  . ezetimibe-simvastatin (VYTORIN) 10-80 MG per tablet Take 1 tablet by mouth at bedtime.      . insulin aspart protamine-insulin aspart (NOVOLOG 70/30) (70-30) 100 UNIT/ML injection Inject 16 Units into the skin 2 (two) times daily.     . Lanthanum Carbonate (FOSRENOL PO) Take 1 tablet by mouth 3 (three) times daily.    . metoprolol (LOPRESSOR) 100 MG tablet Take 100 mg by mouth 2 (two) times daily.     Marland Kitchen NOVOFINE 32G X 6 MM MISC     . NOVOLOG MIX 70/30 FLEXPEN (70-30) 100 UNIT/ML FlexPen 16 Units 2 (two) times daily.      No facility-administered medications prior to visit.    PAST MEDICAL HISTORY: Past Medical History  Diagnosis Date  . Diabetes mellitus   . Hypertension   . Hyperlipidemia   . CAD (coronary artery disease)   . Parkinson's disease   . Peripheral vascular disease, unspecified (HCC)   . Chronic kidney disease     started dialysis 12/07/12  . Renal artery stenosis (HCC)   . Family history of adverse reaction to anesthesia     daughter has difficulty waking   . S/P arterial stent, 02/13/14 bilateral external iliacs-nitinol self expanding stents 02/14/2014    PAST SURGICAL HISTORY: Past Surgical History  Procedure Laterality Date  . Av fistula placement  08/27/2010    left B-C AVF  . Coronary angioplasty with stent placement  07/06/1999    mid dominant CX stent and PTCA side branch large PABG. Successful PTCA  and stent high grade proximal mid left anterior descending artery stenosis.. Successful PTCA high grade proximal nondominant RCA .   Marland Kitchen Gallbladder surgery  1985  . US echocardiography  05/22/2011    mild diastolic dysfunction,mildly dilated LA,mild MR and TR  . Nm myocar perf wall motion  10/08/2010    low risk scan; mild ischemia in a small anterolateral segment  . Iliac artery stent Bilateral 02/13/2014    dr  berry  . Lower extremity angiogram N/A 02/13/2014    Procedure: LOWER EXTREMITY ANGIOGRAM;  Surgeon: Runell Gess, MD;  Location: [facility] CATH LAB;  Service: Cardiovascular;  Laterality: N/A;    FAMILY HISTORY: Family History  Problem Relation Age of Onset  . Kidney disease Mother   . Hypertension Mother   . Heart disease Father   . Other Brother     sarcoidosis  . Stroke Mother   . Heart failure Father     SOCIAL HISTORY: Social History   Social History  . Marital Status: Married    Spouse Name: [name]  . Number of Children: 4  . Years of Education: 12   Occupational History  .      retired   Social History Main Topics  . Smoking status: Former Smoker -- 0.30 packs/day for 10 years    Types: Cigarettes    Quit date: 07/18/1999  . Smokeless tobacco: Never Used  . Alcohol Use: No  . Drug Use:  No  . Sexual Activity: Not on file   Other Topics Concern  . Not on file   Social History Narrative   Patient lives at home with her husband [name]). Patient is retired, [employer].   Patient 1/2 cup of caffeine daily.   Right handed.     PHYSICAL EXAM  Filed Vitals:   10/09/14 1034  BP: 118/56  Pulse: 68  Height: 5\' 1"  (1.549 m)  Weight: 142 lb 6.4 oz (64.592 kg)   Body mass index is 26.92 kg/(m^2). Generalized: Well developed, in no acute distress , masking of the face Head: normocephalic and atraumatic,. Oropharynx benign  Neck: Supple, no carotid bruits  Cardiac: Regular rate rhythm, no murmur  Musculoskeletal: No deformity   Neurological examination   Mentation: Alert oriented to time, place, history taking. Follows all commands speech and language fluent  Cranial nerve II-XII: Pupils were equal round reactive to light extraocular movements were full, visual field were full on confrontational test. Facial sensation and strength were normal. hearing was intact to finger rubbing bilaterally. Uvula tongue midline. head turning and shoulder shrug  were normal and symmetric.Tongue protrusion into cheek strength was normal. Motor: normal bulk and tone, full strength in the BUE, BLE, fine finger, intermittent bilateral resting tremor, cogwheeling at wrist and elbows left worse than right, mild bradykinesia . Coordination: finger-nose-finger, heel-to-shin bilaterally, no dysmetria Reflexes: Brachioradialis 2/2, biceps 2/2, triceps 2/2, patellar 2/2, Achilles trace plantar responses were flexor bilaterally. Gait and Station: Rising up from seated position without assistance, wide based, ambulate with a single-point cane no freezing spells upon turning, mild retropulsion instability   DIAGNOSTIC DATA (LABS, IMAGING, TESTING) - I reviewed patient records, labs, notes, testing and imaging myself where available.  Lab Results  Component Value Date   WBC 8.4 02/14/2014   HGB 11.8* 02/14/2014   HCT 35.6* 02/14/2014   MCV 87.9 02/14/2014   PLT 190 02/14/2014      Component Value Date/Time   NA 132* 02/14/2014 0443   K 5.9* 02/14/2014 0443   CL 95* 02/14/2014 0443   CO2 24 02/14/2014 0443   GLUCOSE 211* 02/14/2014 0443   BUN 67* 02/14/2014 0443   CREATININE 7.82* 02/14/2014 0443   CREATININE 7.76* 02/07/2014 0934   CALCIUM 7.9* 02/14/2014 0443   GFRNONAA 5* 02/14/2014 0443   GFRAA 5* 02/14/2014 0443    Lab Results  Component Value Date   TSH 0.684 02/07/2014      ASSESSMENT AND PLAN  74 y.o. year old female  has a past medical history of Diabetes mellitus; Hypertension; Hyperlipidemia; CAD (coronary artery disease); Parkinson's disease; Peripheral vascular disease, unspecified (HCC); Chronic kidney disease; Renal artery stenosis (HCC);  here to follow-up for her Parkinson's disease.Recent fall 08/02/14 CT of the head and neck with Chronic brain atrophy and white matter microvascular ischemic changes.No acute intracranial process or hemorrhage.No acute fracture or osseous finding of the cervical spine. Diffuse cervical  spondylosis, degenerative change and facet arthropathy    PLAN: Continue carbidopa levodopa at current dose, on dialysis days take 8 AM dose 30 minutes prior to eating,  dialysis appointment usually around 12noon. Will refill Use walker /cane at all times for safe ambulation to prevent falls at high risk for falls Keep blood sugar in good control recent HgbA1C 8 which means her blood sugar reading around 200 -which is abnormal  I spent additional 10 min in total face to face time with the patient and husband more than 50% of which was  spent counseling and coordination of care, reviewing test results reviewing medications and discussing and reviewing the diagnosis of Parkinson's disease and treatment options. ,Given printed information on Parkinson's disease and the importance of regular exercise and rest periods during the day to prevent exhaustion. Also discussed fall prevention and the importance of using an assistive device properly. Follow-up in 6 months.Vst time 25 min  Next visit with Dr. Carmelina Noun, [facility], [facility], APRN  [facility] [address], [address] [place], [place] [address] [phone]

## 2014-10-09 NOTE — Progress Notes (Signed)
I have reviewed and agreed above plan. 

## 2014-10-24 ENCOUNTER — Other Ambulatory Visit: Payer: Self-pay | Admitting: Cardiology

## 2014-12-13 ENCOUNTER — Ambulatory Visit: Payer: Medicare Other | Admitting: Cardiology

## 2014-12-28 ENCOUNTER — Ambulatory Visit (INDEPENDENT_AMBULATORY_CARE_PROVIDER_SITE_OTHER): Payer: Medicare Other | Admitting: Cardiology

## 2014-12-28 ENCOUNTER — Encounter: Payer: Self-pay | Admitting: Cardiology

## 2014-12-28 VITALS — BP 128/62 | HR 71 | Ht 61.0 in | Wt 141.3 lb

## 2014-12-28 DIAGNOSIS — R1013 Epigastric pain: Secondary | ICD-10-CM | POA: Insufficient documentation

## 2014-12-28 DIAGNOSIS — I2510 Atherosclerotic heart disease of native coronary artery without angina pectoris: Secondary | ICD-10-CM | POA: Diagnosis not present

## 2014-12-28 DIAGNOSIS — R079 Chest pain, unspecified: Secondary | ICD-10-CM

## 2014-12-28 DIAGNOSIS — I10 Essential (primary) hypertension: Secondary | ICD-10-CM | POA: Diagnosis not present

## 2014-12-28 DIAGNOSIS — I2583 Coronary atherosclerosis due to lipid rich plaque: Principal | ICD-10-CM

## 2014-12-28 DIAGNOSIS — N186 End stage renal disease: Secondary | ICD-10-CM

## 2014-12-28 DIAGNOSIS — G20 Parkinson's disease: Secondary | ICD-10-CM

## 2014-12-28 DIAGNOSIS — I739 Peripheral vascular disease, unspecified: Secondary | ICD-10-CM

## 2014-12-28 MED ORDER — PANTOPRAZOLE SODIUM 40 MG PO TBEC: 40.0000 mg | DELAYED_RELEASE_TABLET | Freq: Every day | ORAL | Status: AC

## 2014-12-28 NOTE — Assessment & Plan Note (Signed)
-  Followed by Neurology. °

## 2014-12-28 NOTE — Assessment & Plan Note (Signed)
Stable

## 2014-12-28 NOTE — Patient Instructions (Signed)
Your physician has recommended you make the following change in your medication: a new prescription for pantoprazole has been sent to your [facility] pharmacy.  Your physician wants you to follow-up in: 6 months or sooner if needed with Dr Allyson SabalBerry. You will receive a reminder letter in the mail two months in advance. If you don't receive a letter, please call our office to schedule the follow-up appointment.  If you need a refill on your cardiac medications before your next appointment, please call your pharmacy.

## 2014-12-28 NOTE — Progress Notes (Signed)
12/28/2014 Sierra Riley   [birth year]  [id number]  Primary Physician Sierra Garbe, MD Primary Cardiologist: Dr Sierra Riley  HPI:   Sierra Riley is a 74 year old married African American female, formally a patient Dr. Kandis Riley. Her primary care physician is Dr. Wylene Riley .Her history is remarkable for CAD status post intervention by Dr. Alanda Riley 07/05/99 on all 3 coronary arteries with stenting of her LAD and circumflex. She has normal LV function and a low risk Myoview 10/08/10. Dr Sierra Riley has performed PV interventions on her. She has ESRD and is on HD. She has Parkinson's with significant resting tremor. She is in the office today for a 6 month check up. She complains of some epigastric "burning" when laying down at night and occasionally on dialysis. She denies exertional chest discomfort. Her symptoms are relieved with belching.    Current Outpatient Prescriptions  Medication Sig Dispense Refill  . acetaminophen (TYLENOL) 325 MG tablet Take 2 tablets (650 mg total) by mouth every 4 (four) hours as needed for headache or mild pain.    Marland Kitchen amLODipine (NORVASC) 10 MG tablet Take 10 mg by mouth daily.   0  . aspirin 81 MG tablet Take 81 mg by mouth daily.    . carbidopa-levodopa (SINEMET IR) 25-100 MG tablet 1.5 tabs 7am, 1.5 12n, 1 tab at 4pm 1 tab at 8pm 150 tablet 6  . cloNIDine (CATAPRES) 0.3 MG tablet Take 1 tablet by mouth 2 (two) times daily.     . clopidogrel (PLAVIX) 75 MG tablet TAKE ONE TABLET BY MOUTH ONCE DAILY WITH BREAKFAST 30 tablet 8  . ezetimibe-simvastatin (VYTORIN) 10-80 MG per tablet Take 1 tablet by mouth at bedtime.      . insulin aspart protamine-insulin aspart (NOVOLOG 70/30) (70-30) 100 UNIT/ML injection Inject 16 Units into the skin 2 (two) times daily.     . Lanthanum Carbonate (FOSRENOL PO) Take 1 tablet by mouth 3 (three) times daily.    . metoprolol (LOPRESSOR) 100 MG tablet Take 100 mg by mouth 2 (two) times daily.     Marland Kitchen NOVOFINE 32G X 6 MM MISC     . NOVOLOG  MIX 70/30 FLEXPEN (70-30) 100 UNIT/ML FlexPen 16 Units 2 (two) times daily.     . pantoprazole (PROTONIX) 40 MG tablet Take 1 tablet (40 mg total) by mouth daily. 30 tablet 11   No current facility-administered medications for this visit.    No Known Allergies  Social History   Social History  . Marital Status: Married    Spouse Name: Sierra Riley  . Number of Children: 4  . Years of Education: 12   Occupational History  .      retired   Social History Main Topics  . Smoking status: Former Smoker -- 0.30 packs/day for 10 years    Types: Cigarettes    Quit date: 07/18/1999  . Smokeless tobacco: Never Used  . Alcohol Use: No  . Drug Use: No  . Sexual Activity: Not on file   Other Topics Concern  . Not on file   Social History Narrative   Patient lives at home with her husband Sierra Riley). Patient is retired, [employer].   Patient 1/2 cup of caffeine daily.   Right handed.     Review of Systems: General: negative for chills, fever, night sweats or weight changes.  Cardiovascular: negative for chest pain, dyspnea on exertion, edema, orthopnea, palpitations, paroxysmal nocturnal dyspnea or shortness of breath Dermatological: negative for rash Respiratory: negative for cough  or wheezing Urologic: negative for hematuria Abdominal: negative for nausea, vomiting, diarrhea, bright red blood per rectum, melena, or hematemesis Neurologic: negative for visual changes, syncope, or dizziness All other systems reviewed and are otherwise negative except as noted above.    Blood pressure 128/62, pulse 71, height 5\' 1"  (1.549 m), weight 141 lb 4.8 oz (64.093 kg).  General appearance: alert, cooperative, no distress and significant resting tremor Lungs: clear to auscultation bilaterally Heart: regular rate and rhythm and soft systolic murmur Extremities: LUE AVF Skin: Skin color, texture, turgor normal. No rashes or lesions Neurologic: Grossly normal  EKG NSR, NSST  changes  ASSESSMENT AND PLAN:   Epigastric discomfort Chest burning, worse when she lays flat and when she is on dialysis, better with belching  ESRD (end stage renal disease) HD at [facility], Sat- Dr Sierra Riley follows  Essential hypertension Controlled  Parkinson's disease Followed by Neurology  Peripheral arterial disease Stable  CAD S/P percutaneous coronary angioplasty Remote PCI- no angina, low risk Myoview 2012   PLAN  Add PPI. F/U Dr Sierra SabalBerry 6 months.  Corine ShelterKILROY,Sierra Riley 12/28/2014 8:28 AM

## 2014-12-28 NOTE — Assessment & Plan Note (Signed)
Chest burning, worse when she lays flat and when she is on dialysis, better with belching

## 2014-12-28 NOTE — Assessment & Plan Note (Signed)
HD at [facility], Sat- Dr Arrie Aranoladonato follows

## 2014-12-28 NOTE — Assessment & Plan Note (Signed)
Remote PCI- no angina, low risk Myoview 2012

## 2014-12-28 NOTE — Assessment & Plan Note (Signed)
Controlled.  

## 2015-01-05 ENCOUNTER — Other Ambulatory Visit: Payer: Self-pay | Admitting: Cardiovascular Disease

## 2015-01-05 ENCOUNTER — Ambulatory Visit (HOSPITAL_COMMUNITY)
Admission: RE | Admit: 2015-01-05 | Discharge: 2015-01-05 | Disposition: A | Discharge status: Home / Self Care | Payer: Medicare Other | Source: Ambulatory Visit | Attending: Cardiovascular Disease | Admitting: Cardiovascular Disease

## 2015-01-05 DIAGNOSIS — R0989 Other specified symptoms and signs involving the circulatory and respiratory systems: Secondary | ICD-10-CM | POA: Diagnosis not present

## 2015-01-05 DIAGNOSIS — I129 Hypertensive chronic kidney disease with stage 1 through stage 4 chronic kidney disease, or unspecified chronic kidney disease: Secondary | ICD-10-CM | POA: Diagnosis not present

## 2015-01-05 DIAGNOSIS — I739 Peripheral vascular disease, unspecified: Secondary | ICD-10-CM

## 2015-01-05 DIAGNOSIS — I700 Atherosclerosis of aorta: Secondary | ICD-10-CM | POA: Diagnosis not present

## 2015-01-05 DIAGNOSIS — I6523 Occlusion and stenosis of bilateral carotid arteries: Secondary | ICD-10-CM | POA: Diagnosis not present

## 2015-01-05 DIAGNOSIS — R938 Abnormal findings on diagnostic imaging of other specified body structures: Secondary | ICD-10-CM | POA: Insufficient documentation

## 2015-01-05 DIAGNOSIS — N189 Chronic kidney disease, unspecified: Secondary | ICD-10-CM | POA: Diagnosis not present

## 2015-01-05 DIAGNOSIS — E119 Type 2 diabetes mellitus without complications: Secondary | ICD-10-CM | POA: Insufficient documentation

## 2015-01-05 DIAGNOSIS — I10 Essential (primary) hypertension: Secondary | ICD-10-CM | POA: Insufficient documentation

## 2015-01-05 DIAGNOSIS — E785 Hyperlipidemia, unspecified: Secondary | ICD-10-CM | POA: Insufficient documentation

## 2015-01-12 ENCOUNTER — Telehealth: Payer: Self-pay

## 2015-01-12 DIAGNOSIS — I739 Peripheral vascular disease, unspecified: Secondary | ICD-10-CM

## 2015-01-12 NOTE — Telephone Encounter (Signed)
-----   Message from Runell GessJonathan J Berry, MD sent at 01/08/2015  5:05 PM EST ----- No change from prior study. Repeat in 12 months.

## 2015-02-12 ENCOUNTER — Ambulatory Visit (INDEPENDENT_AMBULATORY_CARE_PROVIDER_SITE_OTHER): Payer: Medicare Other | Admitting: Nurse Practitioner

## 2015-02-12 ENCOUNTER — Encounter: Payer: Self-pay | Admitting: Nurse Practitioner

## 2015-02-12 VITALS — BP 110/58 | HR 70 | Ht 61.0 in | Wt 141.2 lb

## 2015-02-12 DIAGNOSIS — G20 Parkinson's disease: Secondary | ICD-10-CM | POA: Diagnosis not present

## 2015-02-12 DIAGNOSIS — I10 Essential (primary) hypertension: Secondary | ICD-10-CM | POA: Diagnosis not present

## 2015-02-12 DIAGNOSIS — N186 End stage renal disease: Secondary | ICD-10-CM | POA: Diagnosis not present

## 2015-02-12 DIAGNOSIS — R269 Unspecified abnormalities of gait and mobility: Secondary | ICD-10-CM

## 2015-02-12 MED ORDER — CARBIDOPA-LEVODOPA 25-100 MG PO TABS: ORAL_TABLET | ORAL | Status: AC

## 2015-02-12 NOTE — Progress Notes (Signed)
I have reviewed and agreed above plan. 

## 2015-02-12 NOTE — Progress Notes (Signed)
[facility]  PATIENT: Sierra Riley DOB: December 31, 1940   REASON FOR VISIT: Follow-up for Parkinson's disease, gait disorder HISTORY FROM: Patient    HISTORY OF PRESENT ILLNESS:02/12/15 Sierra Riley, 75 year old female returns for follow-up. She has history of idiopathic Parkinson's disease and gait abnormality. No falls since last seen. She is using a single-point cane. She goes to dialysis 3 times a week and [facility] received some information from her dialysis clinic that her tremors are really bad about 2 hours into her dialysis. Patient tells me she is not always compliant with taking her medication prior to treatments.She has variable times  taking the medication in terms of timing. She also says her Sinemet controls her tremors on the days that she does not go to dialysis.She returns for reevaluation  10/09/14 Sierra Riley, 75 year old female returns for follow-up. She has a history of idiopathic Parkinson's disease and gait abnormality. She has had 3 falls since last seen the most recent occurring on 08/02/2014 when she was seen in the emergency room.Reviewed CT of the head and neck with Chronic brain atrophy and white matter microvascular ischemic changes.No acute intracranial process or hemorrhage.No acute fracture or osseous finding of the cervical spine. Diffusecervical spondylosis, degenerative change and facet arthropathy. She has both a cane, and walker but the husband claims she does not use them as directed in the past. She also has dialysis treatments 3 times a week. Most recent hemoglobin A1c was 8. She had an increase in her insulin dose. She gets no regular exercise and she does not perform any of the exercises that she has been given in the past. She is currently on Sinemet 4 times a day. She remains independent in feeding dressing and bathing. She no longer cooks. She returns for reevaluation 3/31/16[name], 74 year old female returns for follow-up. She was last seen  in this office by Dr. Terrace Arabia 07/18/2013. Sierra Riley was diagnosed with idiopathic Parkinson's disease since 2007, presenting with bilateral hands tremor, right worse than left,also gradual onset of gait difficulty. she also complains of excessive dreaming. She was a patient of Dr. Lissa Morales, she was treated with Sinemet CR 50/200ER at 8:00 AM, 2 PM, 8 PM. she tolerated medication well, but admit not compliant sometimes,she did notice wearing off, especially when she did not take the medicine on time. 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  Sinemet ER was changed to short acting sinemet 25/100 tid since Sept 2013, She does very little cooking. Independent with ADL's. She does not exercise.  She also has past medical history of hypertension, diabetes, She has a history of fluid retention taking Requip was discontinued since 09/28/2012. She is now on dialysis since November 2014. Marland Kitchen She is now taking Sinemet 25/100 4 times daily. She does not take it on a regular basis. She complains of worsening tremor as well as more gait difficulty and freezing spells before next dose. She is using a single-point cane to ambulate. She has not had any falls since last seen. She goes to dialysis 3 days a week     REVIEW OF SYSTEMS: Full 14 system review of systems performed and notable only  for those listed, all others are neg:  Constitutional: neg  Cardiovascular: leg swelling Ear/Nose/Throat: neg  Skin: neg Eyes: neg Respiratory: neg Gastroitestinal: constipation Hematology/Lymphatic: neg  Endocrine: neg Musculoskeletal:neg Allergy/Immunology: neg Neurological:  weakness, tremors Psychiatric: neg Sleep : neg   ALLERGIES: No Known Allergies  HOME MEDICATIONS: Outpatient Prescriptions Prior to Visit  Medication Sig Dispense Refill  . acetaminophen (TYLENOL) 325 MG tablet Take 2 tablets (650 mg total) by mouth every 4 (four) hours as needed for headache or mild pain.    Marland Kitchen amLODipine (NORVASC) 10 MG tablet Take 10 mg by mouth daily.   0  . aspirin 81 MG tablet Take 81 mg by mouth daily.    . carbidopa-levodopa (SINEMET IR) 25-100 MG tablet 1.5 tabs 7am, 1.5 12n, 1 tab at 4pm 1 tab at 8pm 150 tablet 6  . cloNIDine (CATAPRES) 0.3 MG tablet Take 1 tablet by mouth 2 (two) times daily.     . clopidogrel (PLAVIX) 75 MG tablet TAKE ONE TABLET BY MOUTH ONCE DAILY WITH BREAKFAST 30 tablet 8  . ezetimibe-simvastatin (VYTORIN) 10-80 MG per tablet Take 1 tablet by mouth at bedtime.      . Lanthanum Carbonate (FOSRENOL PO) Take 1 tablet by mouth 3 (three) times daily.    . metoprolol (LOPRESSOR) 100 MG tablet Take 100 mg by mouth 2 (two) times daily.     Marland Kitchen NOVOFINE 32G X 6 MM MISC     . NOVOLOG MIX 70/30 FLEXPEN (70-30) 100 UNIT/ML FlexPen 16 Units 2 (two) times daily.     . pantoprazole (PROTONIX) 40 MG tablet Take 1 tablet (40 mg total) by mouth daily. 30 tablet 11  . insulin aspart protamine-insulin aspart (NOVOLOG 70/30) (70-30) 100 UNIT/ML injection Inject 16 Units into the skin 2 (two) times daily.      No facility-administered medications prior to visit.    PAST MEDICAL HISTORY: Past Medical History  Diagnosis Date  . Diabetes mellitus   . Hypertension   . Hyperlipidemia   . CAD (coronary artery disease)   . Parkinson's disease   . Peripheral vascular disease, unspecified (HCC)   . Chronic kidney disease     started dialysis 12/07/12  . Renal artery stenosis (HCC)   . Family history of adverse reaction to anesthesia     daughter has difficulty waking   . S/P arterial stent, 02/13/14 bilateral external iliacs-nitinol self expanding stents  02/14/2014    PAST SURGICAL HISTORY: Past Surgical History  Procedure Laterality Date  . Av fistula placement  08/27/2010    left B-C AVF  . Coronary angioplasty with stent placement  07/06/1999    mid dominant CX stent and PTCA side branch large PABG. Successful PTCA  and stent high grade proximal mid left anterior descending artery stenosis.. Successful PTCA high grade proximal nondominant RCA .   Marland Kitchen Gallbladder surgery  1985  . US echocardiography  05/22/2011    mild diastolic dysfunction,mildly dilated LA,mild MR and TR  . Nm myocar perf wall motion  10/08/2010    low risk scan; mild ischemia in a small anterolateral segment  . Iliac artery stent Bilateral 02/13/2014    dr berry  . Lower extremity angiogram N/A 02/13/2014    Procedure: LOWER EXTREMITY ANGIOGRAM;  Surgeon: Runell Gess, MD;  Location: [facility] CATH LAB;  Service: Cardiovascular;  Laterality: N/A;    FAMILY HISTORY: Family History  Problem Relation Age of Onset  . Kidney disease Mother   . Hypertension Mother   . Heart  disease Father   . Other Brother     sarcoidosis  . Stroke Mother   . Heart failure Father     SOCIAL HISTORY: Social History   Social History  . Marital Status: Married    Spouse Name: [name]  . Number of Children: 4  . Years of Education: 12   Occupational History  .      retired   Social History Main Topics  . Smoking status: Former Smoker -- 0.30 packs/day for 10 years    Types: Cigarettes    Quit date: 07/18/1999  . Smokeless tobacco: Never Used  . Alcohol Use: No  . Drug Use: No  . Sexual Activity: Not on file   Other Topics Concern  . Not on file   Social History Narrative   Patient lives at home with her husband [name]). Patient is retired, [employer].   Patient 1/2 cup of caffeine daily.   Right handed.     PHYSICAL EXAM  Filed Vitals:   02/12/15 1000  BP: 110/58  Pulse: 70  Height:  (1.549 m)  Weight: 141 lb 3.2 oz (64.048 kg)   Body mass index  is 26.69 kg/(m^2). Generalized: Well developed, in no acute distress , masking of the face Head: normocephalic and atraumatic,. Oropharynx benign  Neck: Supple, no carotid bruits  Cardiac: Regular rate rhythm, no murmur  Musculoskeletal: No deformity   Neurological examination   Mentation: Alert oriented to time, place, history taking. Follows all commands speech and language fluent  Cranial nerve II-XII: Pupils were equal round reactive to light extraocular movements were full, visual field were full on confrontational test. Facial sensation and strength were normal. hearing was intact to finger rubbing bilaterally. Uvula tongue midline. head turning and shoulder shrug were normal and symmetric.Tongue protrusion into cheek strength was normal. Motor: normal bulk and tone, full strength in the BUE, BLE, fine finger, intermittent bilateral resting tremor left hand, cogwheeling at wrist and elbows left worse than right, mild bradykinesia . Coordination: finger-nose-finger, heel-to-shin bilaterally, no dysmetria Reflexes: Brachioradialis 2/2, biceps 2/2, triceps 2/2, patellar 2/2, Achilles trace plantar responses were flexor bilaterally. Gait and Station: Rising up from seated position without assistance, wide based, ambulate with a single-point cane no freezing spells upon turning, mild retropulsion instability   DIAGNOSTIC DATA (LABS, IMAGING, TESTING) -     ASSESSMENT AND PLAN 75 y.o. year old female has a past medical history of Diabetes mellitus; Hypertension; Hyperlipidemia; CAD (coronary artery disease); Parkinson's disease; Peripheral vascular disease, unspecified (HCC); Chronic kidney disease; Renal artery stenosis (HCC); here to follow-up for her Parkinson's disease.Patient claims her symptoms are well controlled on the days that she does not go to dialysis. She is not always taking her medication as ordered.  Discussed with Dr. Terrace Arabia Please make sure you are taking Sinemet  as directed.1.5 tabs at 7am and 12noon, 1 tablet 4 and 8pm Need to start walking, exercising, use cane at all times for safe ambulation  May take additional dose 2 hours into dialysis treatment if necessary F/U with Dr. Terrace Arabia in 3 months Nilda Riggs, [facility], [facility], APRN  [facility] [address] [place], [place] [address] [phone]

## 2015-02-12 NOTE — Patient Instructions (Signed)
Please make sure you are taking Sinemet as directed.  May take additional dose 2 hours into dialysis treatment if necessary F/U with Dr. Terrace Arabia in 3 months

## 2015-03-26 ENCOUNTER — Emergency Department (HOSPITAL_COMMUNITY)
Admission: EM | Admit: 2015-03-26 | Discharge: 2015-04-07 | Disposition: E | Payer: Medicare Other | Attending: Emergency Medicine | Admitting: Emergency Medicine

## 2015-03-26 ENCOUNTER — Encounter (HOSPITAL_COMMUNITY): Payer: Self-pay

## 2015-03-26 DIAGNOSIS — E785 Hyperlipidemia, unspecified: Secondary | ICD-10-CM | POA: Diagnosis not present

## 2015-03-26 DIAGNOSIS — E119 Type 2 diabetes mellitus without complications: Secondary | ICD-10-CM | POA: Diagnosis not present

## 2015-03-26 DIAGNOSIS — N186 End stage renal disease: Secondary | ICD-10-CM | POA: Diagnosis not present

## 2015-03-26 DIAGNOSIS — Z87891 Personal history of nicotine dependence: Secondary | ICD-10-CM | POA: Insufficient documentation

## 2015-03-26 DIAGNOSIS — I2510 Atherosclerotic heart disease of native coronary artery without angina pectoris: Secondary | ICD-10-CM | POA: Diagnosis not present

## 2015-03-26 DIAGNOSIS — Z79899 Other long term (current) drug therapy: Secondary | ICD-10-CM | POA: Diagnosis not present

## 2015-03-26 DIAGNOSIS — Z7902 Long term (current) use of antithrombotics/antiplatelets: Secondary | ICD-10-CM | POA: Insufficient documentation

## 2015-03-26 DIAGNOSIS — I469 Cardiac arrest, cause unspecified: Secondary | ICD-10-CM | POA: Insufficient documentation

## 2015-03-26 DIAGNOSIS — G20 Parkinson's disease: Secondary | ICD-10-CM | POA: Diagnosis not present

## 2015-03-26 DIAGNOSIS — Z7982 Long term (current) use of aspirin: Secondary | ICD-10-CM | POA: Insufficient documentation

## 2015-03-26 DIAGNOSIS — Z992 Dependence on renal dialysis: Secondary | ICD-10-CM | POA: Insufficient documentation

## 2015-03-26 DIAGNOSIS — Z9861 Coronary angioplasty status: Secondary | ICD-10-CM | POA: Insufficient documentation

## 2015-03-26 DIAGNOSIS — I739 Peripheral vascular disease, unspecified: Secondary | ICD-10-CM | POA: Insufficient documentation

## 2015-03-26 DIAGNOSIS — I120 Hypertensive chronic kidney disease with stage 5 chronic kidney disease or end stage renal disease: Secondary | ICD-10-CM | POA: Insufficient documentation

## 2015-04-07 NOTE — Code Documentation (Signed)
[place]. [place]. Pt has a [employer], and is being bagged.  Paramedics report that pt.s husband  Reported to them that pt. Was not feeling well this am, and told her husband that she thought her blood sugar was low.  Husband found pt. On the floor, unresponsive.  Paramedics reports that they gave 9 epis, , Maxed out on amiodarone, gave 1 amp of Bicarb and Calcium.    She also was shocked .  Paramedics also reports that pt.s last dialysis was Saturday.

## 2015-04-07 NOTE — ED Notes (Signed)
Spoke with family. Family does not know what funeral home at this time. Gave family Bed control  Phone,

## 2015-04-07 NOTE — Code Documentation (Signed)
Pt. Arrived and placed on our stretcher, placed on monitor, Dr. Jeraldine LootsLockwood at the bedside.   Dr. Jeraldine LootsLockwood verified no heart activity via US,. Pt. Pronounced Expired at 8:03

## 2015-04-07 NOTE — ED Notes (Signed)
Pt. Arrived , CPR in Progress. ( Refer to Code Narrator)

## 2015-04-07 NOTE — ED Notes (Signed)
Pt. Transferred to Pod C 26.  Family at the bedside.  Funeral Home has not been determined at present time.

## 2015-04-07 NOTE — Code Documentation (Signed)
Our Yong ChannelChaplain Beatrice with the pt.'s husband and family

## 2015-04-07 NOTE — Progress Notes (Signed)
Next of Kin:  Julien NordmannScales,James P Address:  [phone]   Chaplain provided spiritual care via prayer and sat with family as the family lamented.

## 2015-04-07 NOTE — Code Documentation (Signed)
Family at beside. Family given emotional support. 

## 2015-04-07 NOTE — ED Provider Notes (Signed)
CSN: [id number]     Arrival date & time 09-Oct-2015  0759 History   First MD Initiated Contact with Patient 003-Oct-2017 [phone]     Chief Complaint  Patient presents with  . Cardiac Arrest     (Consider location/radiation/quality/duration/timing/severity/associated sxs/prior Treatment) HPI Patient presents in extremis via EMS providers. Per report the patient called 911 after having discomfort about one hour prior to ED arrival. Anus reports that on arrival the patient had agonal respiration, was minimally interactive. They note the patient had ventricular fibrillation, and soon after their arrival was completely unresponsive. Over approximately 55 minutes the patient had sustained ACLS resuscitation measures, including amiodarone, epinephrine, calcium, bicarbonate. Family noted that the patient is a dialysis clinic, last had dialysis 2 days ago. On arrival, the patient is on a long spine board, actively receiving CPR, is unresponsive, not capable of providing any details of history of present illness. Level V caveat. Past Medical History  Diagnosis Date  . Diabetes mellitus   . Hypertension   . Hyperlipidemia   . CAD (coronary artery disease)   . Parkinson's disease   . Peripheral vascular disease, unspecified (HCC)   . Chronic kidney disease     started dialysis 12/07/12  . Renal artery stenosis (HCC)   . Family history of adverse reaction to anesthesia     daughter has difficulty waking   . S/P arterial stent, 02/13/14 bilateral external iliacs-nitinol self expanding stents 02/14/2014   Past Surgical History  Procedure Laterality Date  . Av fistula placement  08/27/2010    left B-C AVF  . Coronary angioplasty with stent placement  07/06/1999    mid dominant CX stent and PTCA side branch large PABG. Successful PTCA  and stent high grade proximal mid left anterior descending artery stenosis.. Successful PTCA high grade proximal nondominant RCA .   Marland Kitchen. Gallbladder surgery  1985  . [place]  echocardiography  05/22/2011    mild diastolic dysfunction,mildly dilated LA,mild MR and TR  . Nm myocar perf wall motion  10/08/2010    low risk scan; mild ischemia in a small anterolateral segment  . Iliac artery stent Bilateral 02/13/2014    dr berry  . Lower extremity angiogram N/A 02/13/2014    Procedure: LOWER EXTREMITY ANGIOGRAM;  Surgeon: Runell GessJonathan J Berry, MD;  Location: [facility] CATH LAB;  Service: Cardiovascular;  Laterality: N/A;   Family History  Problem Relation Age of Onset  . Kidney disease Mother   . Hypertension Mother   . Heart disease Father   . Other Brother     sarcoidosis  . Stroke Mother   . Heart failure Father    Social History  Substance Use Topics  . Smoking status: Former Smoker -- 0.30 packs/day for 10 years    Types: Cigarettes    Quit date: 07/18/1999  . Smokeless tobacco: Never Used  . Alcohol Use: No   OB History    No data available     Review of Systems  Unable to perform ROS: Patient unresponsive      Allergies  Review of patient's allergies indicates no known allergies.  Home Medications   Prior to Admission medications   Medication Sig Start Date End Date Taking? Authorizing Provider  acetaminophen (TYLENOL) 325 MG tablet Take 2 tablets (650 mg total) by mouth every 4 (four) hours as needed for headache or mild pain. 02/14/14   Leone BrandLaura R Ingold, NP  amLODipine (NORVASC) 10 MG tablet Take 10 mg by mouth daily.  01/22/14   Historical  Provider, MD  aspirin 81 MG tablet Take 81 mg by mouth daily.    Historical Provider, MD  carbidopa-levodopa (SINEMET IR) 25-100 MG tablet 1.5 tabs 7am, 1.5 12n, 1 tab at 4pm 1 tab at 8pm and may take additional dose at dialysis if needed 02/12/15   Nilda Riggs, NP  cloNIDine (CATAPRES) 0.3 MG tablet Take 1 tablet by mouth 2 (two) times daily.  10/07/10   Historical Provider, MD  clopidogrel (PLAVIX) 75 MG tablet TAKE ONE TABLET BY MOUTH ONCE DAILY WITH BREAKFAST 10/24/14   Leone Brand, NP    ezetimibe-simvastatin (VYTORIN) 10-80 MG per tablet Take 1 tablet by mouth at bedtime.      Historical Provider, MD  Lanthanum Carbonate (FOSRENOL PO) Take 1 tablet by mouth 3 (three) times daily.    Historical Provider, MD  metoprolol (LOPRESSOR) 100 MG tablet Take 100 mg by mouth 2 (two) times daily.  01/18/14   Historical Provider, MD  NOVOFINE 32G X 6 MM MISC  03/04/13   Historical Provider, MD  NOVOLOG MIX 70/30 FLEXPEN (70-30) 100 UNIT/ML FlexPen 16 Units 2 (two) times daily.  03/12/14   Historical Provider, MD  pantoprazole (PROTONIX) 40 MG tablet Take 1 tablet (40 mg total) by mouth daily. 12/28/14   Abelino Derrick, PA-C   There were no vitals taken for this visit. Physical Exam  Constitutional: She has a sickly appearance. She appears distressed.  HENT:  Head: Normocephalic and atraumatic.  Eyes: Conjunctivae and EOM are normal.  Cardiovascular: Normal rate and regular rhythm.   Pulmonary/Chest: Effort normal and breath sounds normal. No stridor. No respiratory distress.  Abdominal: She exhibits no distension.  Musculoskeletal: She exhibits no edema.       Arms: Neurological:  Unresponsive, flaccid, nonreactive pupils  Skin: Skin is warm and dry.  Psychiatric: Cognition and memory are impaired.  Nursing note and vitals reviewed.   ED Course  Procedures (including critical care time) On arrival the patient was transferred to our monitoring equipment, had pacemaker pads applied, continuous cardiac monitoring instituted. Patient continued to receive CPR via ACLS protocol.  After the first round of CPR concluded, the patient had rhythm check, which did not demonstrate palpable peripheral pulses. Bedside ultrasound did not demonstrate cardiac activity. Patient was pronounced deceased at 67.  Desogestrel be signed by the patient's primary care physician. The patient's husband was here, is aware of his wife's death.   Cardiopulmonary Resuscitation (CPR) Procedure  Note Directed/Performed by: Gerhard Munch I personally directed ancillary staff and/or performed CPR in an effort to regain return of spontaneous circulation and to maintain cardiac, neuro and systemic perfusion.     EMERGENCY DEPARTMENT [place] CARDIAC EXAM "Study: Limited Ultrasound of the heart and pericardium"  INDICATIONS:Cardiac arrest Multiple views of the heart and pericardium are obtained with a multi-frequency probe.  PERFORMED ZO:[id number]  IMAGES ARCHIVED?: No  FINDINGS: Decreased contractility  LIMITATIONS:  Emergent procedure  VIEWS USED: Parasternal long axis and Parasternal short axis  INTERPRETATION: Cardiac activity absent, Pericardial effusioin absent and Decreased contractility  COMMENT:  No cardiac activity   MDM   Final diagnoses:  Cardiac arrest [facility])    Elderly female, dialysis patient presents unresponsive via EMS. Here, the patient is in PTA, has no evidence for ongoing neurologic activity, no evidence for ongoing cardiac activity. Given the prolonged resuscitation, with no return of spontaneous circulation, patient was pronounced dead after additional efforts failed to revive the patient.    Gerhard Munch, MD 03/28/2015 [phone]

## 2015-04-07 DEATH — deceased

## 2015-04-09 ENCOUNTER — Ambulatory Visit: Payer: Medicare Other | Admitting: Neurology

## 2015-05-14 ENCOUNTER — Ambulatory Visit: Payer: Medicare Other | Admitting: Neurology

## 2017-03-19 IMAGING — CT CT HEAD W/O CM
2 of 5 series · 9 of 37 positions shown, 11 images · non-contrast
Comparison: None.

CLINICAL DATA: Fall, scalp laceration posteriorly, history
Parkinson's, fall on blood thinners.

EXAM:
CT HEAD WITHOUT CONTRAST
CT CERVICAL SPINE WITHOUT CONTRAST
TECHNIQUE: Multidetector CT imaging of the head and cervical spine was
performed following the standard protocol without intravenous
contrast. Multiplanar CT image reconstructions of the cervical spine
were also generated.

[Series 308: sag · sagittal · 0.37mm/px · 3 of 55 slices shown]
[im 19/55  brain]
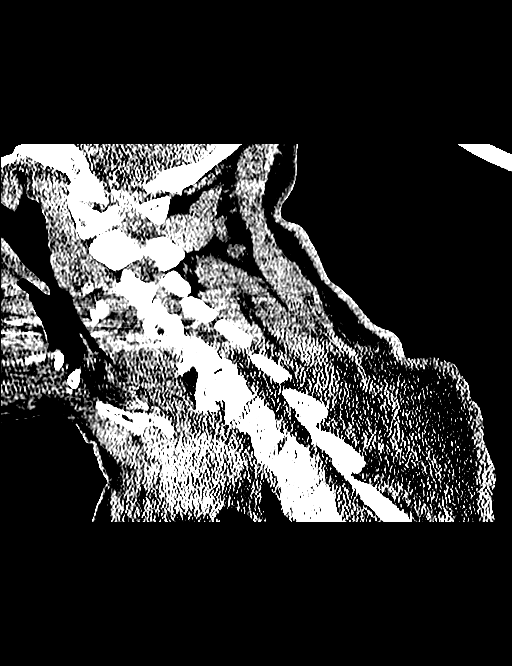
[im 28/55  brain]
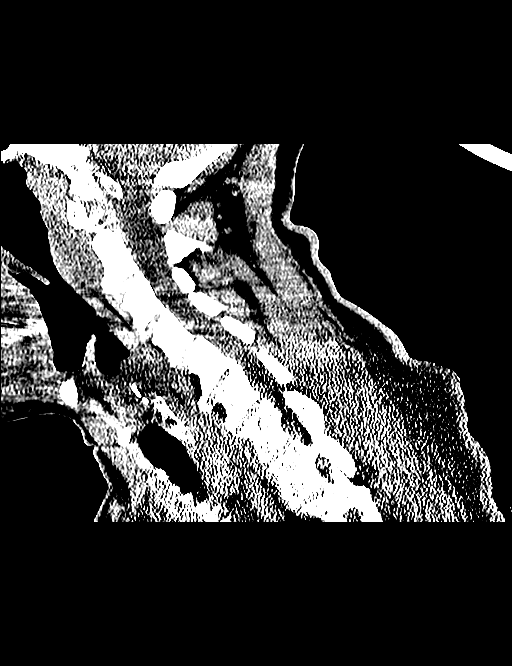
[im 37/55  brain]
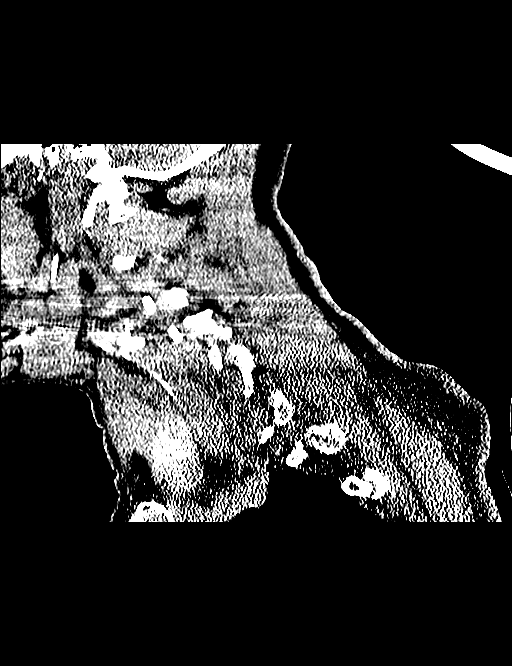

[Series 309: ortho · axial · 0.37mm/px · z∈[+91,+165]mm · 6 of 58 slices shown, 8 images]
[im 9/58  brain]
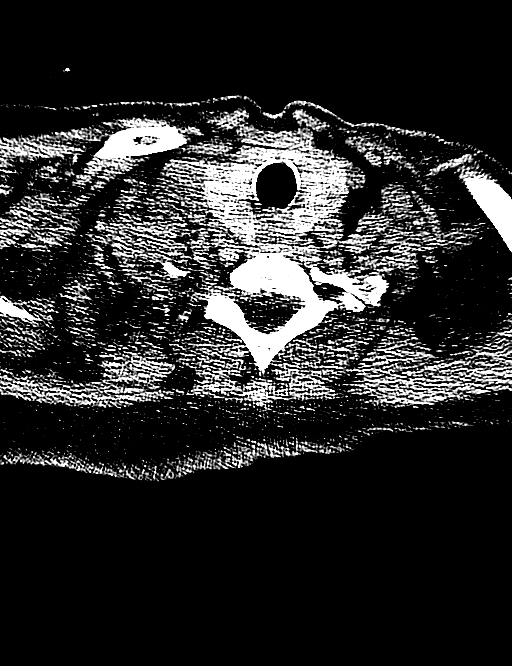
[im 9/58  bone]
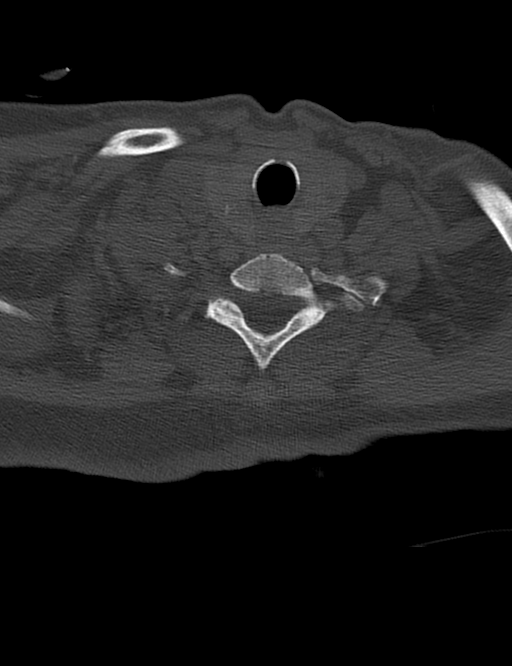
[im 17/58  brain]
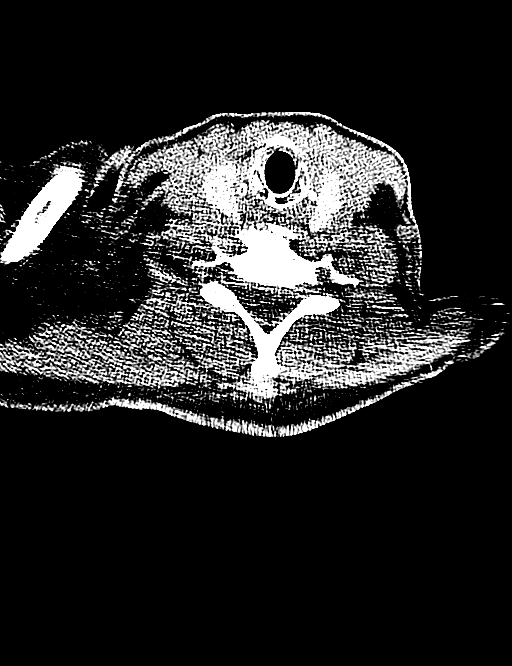
[im 25/58  brain]
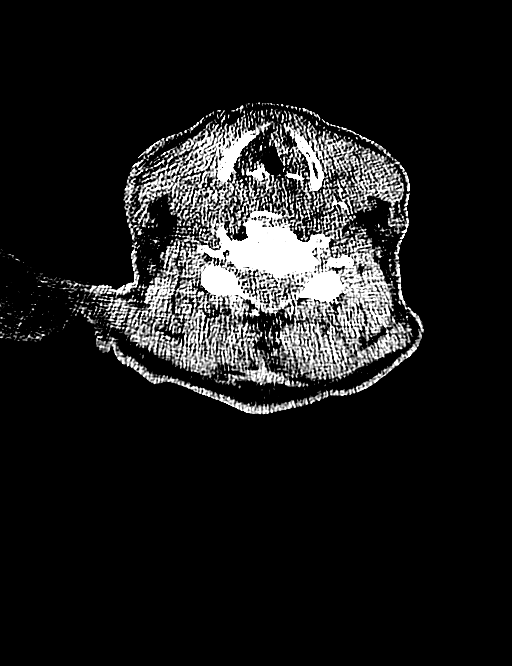
[im 33/58  brain]
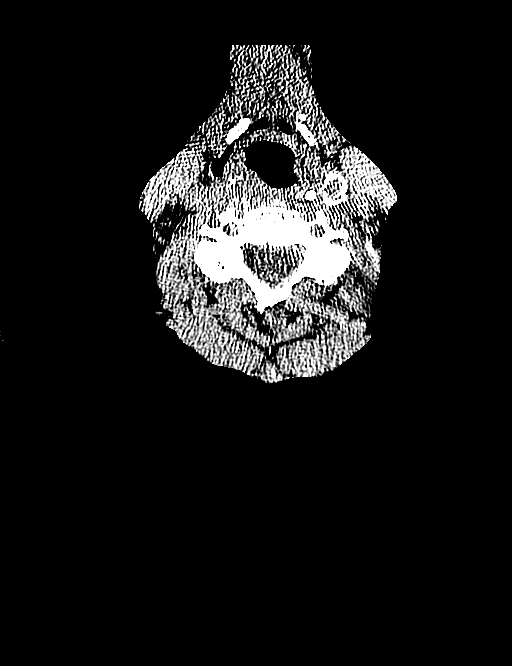
[im 41/58  brain]
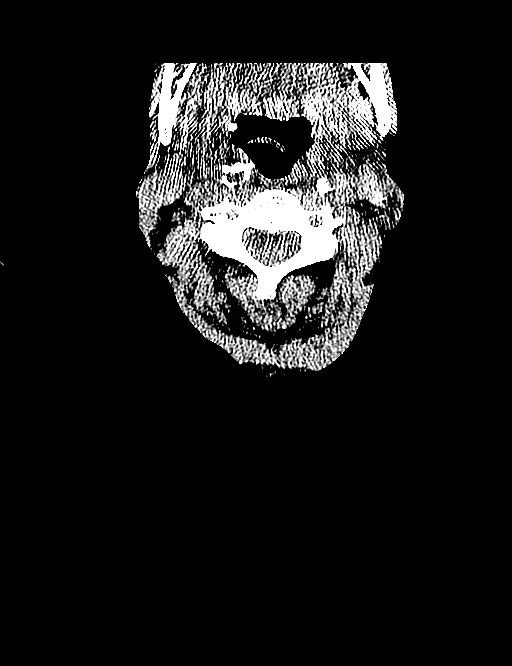
[im 41/58  bone]
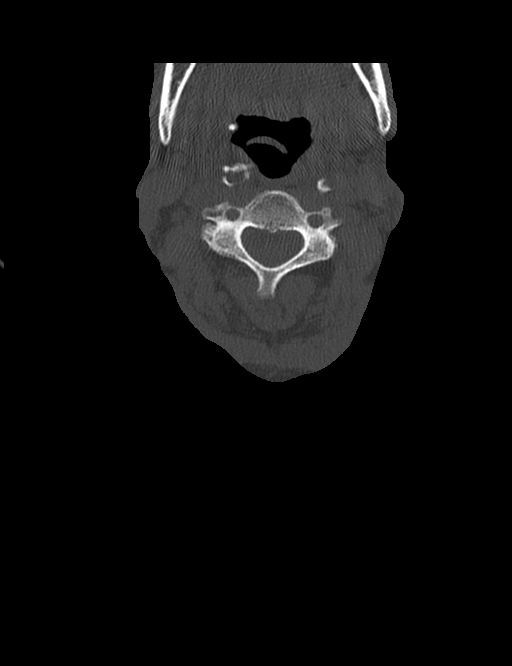
[im 49/58  brain]
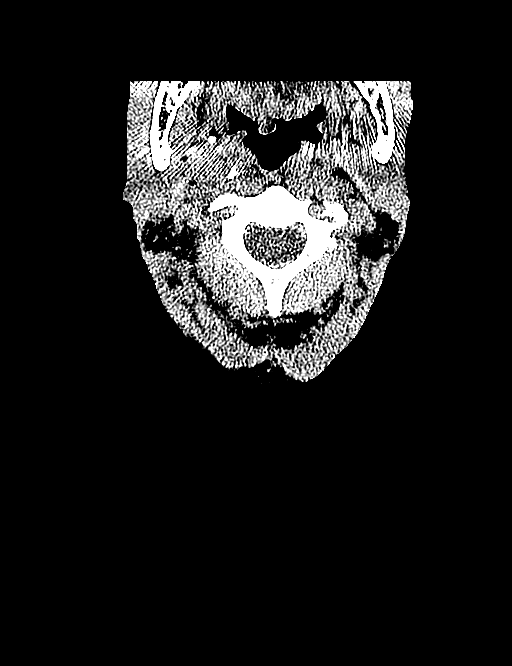

[9 of 37 positions shown; findings below may reference images not displayed]

FINDINGS: CT HEAD FINDINGS

Diffuse brain atrophy noted with chronic white matter microvascular
ischemic changes about the ventricles. No acute intracranial
hemorrhage, definite acute infarction, mass lesion, midline shift,
herniation, hydrocephalus, or extra-axial fluid collection. No focal
mass effect or edema. Gray-white matter differentiation maintained.
Cisterns are patent. Cerebellar atrophy as well. Atherosclerosis of
the intracranial vessels of the skullbase. Mastoids and sinuses
remain clear. Skull appears intact. Right posterior parietal scalp
injury noted.

CT CERVICAL SPINE FINDINGS

Straightened cervical spine alignment may be positional. Multilevel
cervical spondylosis and degenerative change, most apparent C5-6 and
C6-7. No acute fracture or malalignment. Diffuse facet arthropathy
at multiple levels. Intact odontoid and normal prevertebral soft
tissues. Left cervical rib noted at C7. Clear lung apices.
Subclavian and carotid atherosclerosis noted.

Nodular enlargement of the intra thyroid with calcifications.
Recommend follow-up nonemergent thyroid ultrasound.
IMPRESSION: Right posterior parietal scalp entry.

Chronic brain atrophy and white matter microvascular ischemic
changes.

No acute intracranial process or hemorrhage.

No acute fracture or osseous finding of the cervical spine. Diffuse
cervical spondylosis, degenerative change and facet arthropathy.

Inferior thyroid enlarged with calcifications. Recommend nonemergent
thyroid ultrasound.
